# Patient Record
Sex: Male | Born: 1995 | Race: Black or African American | Hispanic: No | Marital: Single | Smoking: Current every day smoker
Health system: Southern US, Community
[De-identification: ages and names within clinical notes are randomized; demographics above are authoritative.]

## PROBLEM LIST (undated history)

## (undated) DIAGNOSIS — K219 Gastro-esophageal reflux disease without esophagitis: Secondary | ICD-10-CM

## (undated) DIAGNOSIS — R51 Headache: Secondary | ICD-10-CM

## (undated) DIAGNOSIS — T7840XA Allergy, unspecified, initial encounter: Secondary | ICD-10-CM

## (undated) DIAGNOSIS — Z87442 Personal history of urinary calculi: Secondary | ICD-10-CM

## (undated) DIAGNOSIS — F4310 Post-traumatic stress disorder, unspecified: Secondary | ICD-10-CM

## (undated) DIAGNOSIS — F909 Attention-deficit hyperactivity disorder, unspecified type: Secondary | ICD-10-CM

## (undated) DIAGNOSIS — R519 Headache, unspecified: Secondary | ICD-10-CM

## (undated) DIAGNOSIS — G8929 Other chronic pain: Secondary | ICD-10-CM

## (undated) HISTORY — PX: TONSILLECTOMY AND ADENOIDECTOMY: SUR1326

## (undated) HISTORY — DX: Gastro-esophageal reflux disease without esophagitis: K21.9

## (undated) HISTORY — DX: Allergy, unspecified, initial encounter: T78.40XA

## (undated) HISTORY — DX: Headache: R51

## (undated) HISTORY — DX: Headache, unspecified: R51.9

## (undated) HISTORY — DX: Attention-deficit hyperactivity disorder, unspecified type: F90.9

## (undated) HISTORY — DX: Post-traumatic stress disorder, unspecified: F43.10

## (undated) HISTORY — DX: Other chronic pain: G89.29

---
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
## 2016-03-12 ENCOUNTER — Encounter: Payer: Self-pay | Admitting: *Deleted

## 2016-03-12 ENCOUNTER — Emergency Department
Admission: EM | Admit: 2016-03-12 | Discharge: 2016-03-12 | Disposition: A | Discharge status: Home / Self Care | Payer: Medicaid Other | Attending: Emergency Medicine | Admitting: Emergency Medicine

## 2016-03-12 DIAGNOSIS — Z79899 Other long term (current) drug therapy: Secondary | ICD-10-CM | POA: Insufficient documentation

## 2016-03-12 DIAGNOSIS — S70362A Insect bite (nonvenomous), left thigh, initial encounter: Secondary | ICD-10-CM | POA: Diagnosis present

## 2016-03-12 DIAGNOSIS — Y939 Activity, unspecified: Secondary | ICD-10-CM | POA: Insufficient documentation

## 2016-03-12 DIAGNOSIS — W57XXXA Bitten or stung by nonvenomous insect and other nonvenomous arthropods, initial encounter: Secondary | ICD-10-CM | POA: Diagnosis not present

## 2016-03-12 DIAGNOSIS — Z792 Long term (current) use of antibiotics: Secondary | ICD-10-CM | POA: Diagnosis not present

## 2016-03-12 DIAGNOSIS — F17210 Nicotine dependence, cigarettes, uncomplicated: Secondary | ICD-10-CM | POA: Diagnosis not present

## 2016-03-12 DIAGNOSIS — F909 Attention-deficit hyperactivity disorder, unspecified type: Secondary | ICD-10-CM | POA: Insufficient documentation

## 2016-03-12 DIAGNOSIS — F1290 Cannabis use, unspecified, uncomplicated: Secondary | ICD-10-CM | POA: Diagnosis not present

## 2016-03-12 DIAGNOSIS — Y929 Unspecified place or not applicable: Secondary | ICD-10-CM | POA: Diagnosis not present

## 2016-03-12 DIAGNOSIS — Y999 Unspecified external cause status: Secondary | ICD-10-CM | POA: Insufficient documentation

## 2016-03-12 NOTE — ED Provider Notes (Signed)
[facility] Emergency Department Provider Note ____________________________________________  Time seen: Approximately 5:15 PM  I have reviewed the triage vital signs and the nursing notes.   HISTORY  Chief Complaint Tick Removal   HPI Michael [name] is a 20 y.o. male is here with complaint of tick bite to his left upper leg. Patient states it is been there for one day. Prior to his arrival in the emergency room the mother removed the body of the tick. The head is embedded in the skin.Patient rates his pain as 7 out of 10 but is not taking any over-the-counter medication.   Past Medical History  Diagnosis Date  . PTSD (post-traumatic stress disorder)   . GERD (gastroesophageal reflux disease)   . ADHD (attention deficit hyperactivity disorder)   . Allergy   . Chronic headache     Patient Active Problem List   Diagnosis Date Noted  . Hx of tympanostomy tubes 09/17/2015  . Attention deficit disorder with hyperactivity 05/09/2015  . Acne 05/09/2015  . Headache 05/09/2015  . Esophageal reflux 05/09/2015  . Personal history of traumatic brain injury 05/09/2015  . 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 Posttraumatic stress disorder 05/09/2015  . Chromophytosis 05/09/2015  . Decreased body weight 05/09/2015    Past Surgical History  Procedure Laterality Date  . Tonsillectomy and adenoidectomy      Current Outpatient Rx  Name  Route  Sig  Dispense  Refill  . azithromycin (ZITHROMAX) 500 MG tablet      Two tablets once   2 tablet   0   . carbamazepine (TEGRETOL) 200 MG tablet   Oral   Take 200 mg by mouth 3 (three) times daily.      2   . ciclopirox (LOPROX) 0.77 % cream   Topical   Apply topically 2 (two) times daily. Up to 4 weeks Patient not taking: Reported on 09/22/2015   90 g   1   . cloNIDine (CATAPRES) 0.1 MG tablet   Oral   Take 0.2 mg by mouth at bedtime.      2   . minocycline (MINOCIN) 100 MG capsule  Oral   Take 1 capsule (100 mg total) by mouth 2 (two) times daily. Patient not taking: Reported on 09/22/2015   60 capsule   0   . OLANZapine zydis (ZYPREXA) 5 MG disintegrating tablet      DISSOLVE 1 TABLET ON TONGUE AT BEDTIME      2   . VYVANSE 30 MG capsule   Oral   Take 30 mg by mouth every morning.      0     Dispense as written.     Allergies Penicillins and Tramadol hcl  Family History  Problem Relation Age of Onset  . Depression Mother   . Hyperlipidemia Mother   . Arthritis Mother   . Mitral valve prolapse Mother   . Alcohol abuse Father   . Epilepsy Brother   . AAA (abdominal aortic aneurysm) Brother     Social History Social History  Substance Use Topics  . Smoking status: Current Some Day Smoker    Types: Cigars  . Smokeless tobacco: None  . Alcohol Use: No    Review of Systems Constitutional: No fever/chills Cardiovascular: Denies chest pain. Respiratory: Denies shortness of breath. Gastrointestinal:  No nausea, no vomiting. Skin:Positive tick bite. Neurological: Negative for headaches, focal weakness or numbness.  10-point ROS otherwise negative.  ____________________________________________   PHYSICAL EXAM:  VITAL SIGNS:  ED Triage Vitals  Enc Vitals Group     BP 03/12/16 1654 131/61 mmHg     Pulse Rate 03/12/16 1654 75     Resp 03/12/16 1654 18     Temp 03/12/16 1654 98.4 F (36.9 C)     Temp Source 03/12/16 1654 Oral     SpO2 03/12/16 1654 97 %     Weight 03/12/16 1654 167 lb (75.751 kg)     Height 03/12/16 1654 5\' 11"  (1.803 m)     Head Cir --      Peak Flow --      Pain Score 03/12/16 1655 7     Pain Loc --      Pain Edu? --      Excl. in GC? --     Constitutional: Alert and oriented. Well appearing and in no acute distress. Eyes: Conjunctivae are normal. PERRL. EOMI. Head: Atraumatic. Nose: No congestion/rhinnorhea. Neck: No stridor.   Respiratory: Normal respiratory effort.   Musculoskeletal:Moves upper and  lower extremities without any difficulty. Normal gait was noted. Neurologic:  Normal speech and language. No gross focal neurologic deficits are appreciated. No gait instability. Skin:  Skin is warm, dry. Left inner upper thigh there is a small dot that is the head of the tick and embedded in the skin. No erythema surrounding skin. Psychiatric: Mood and affect are normal. Speech and behavior are normal.  ____________________________________________   LABS (all labs ordered are listed, but only abnormal results are displayed)  Labs Reviewed - No data to display  PROCEDURES  Procedure(s) performed: Area was prepped with an alcohol swab. Tick head was removed with the 18-gauge needle without any difficulty.   patient tolerated procedure well.  Critical Care performed: No  ____________________________________________   INITIAL IMPRESSION / ASSESSMENT AND PLAN / ED COURSE  Pertinent labs & imaging results that were available during my care of the patient were reviewed by me and considered in my medical decision making (see chart for details).  Patient will follow up with his primary care doctor if any continued problems or signs of infection. He is also told to Circle 4/20 on his calendar as the days that he had a tick bite in case there is problems in the future.  ____________________________________________   FINAL CLINICAL IMPRESSION(S) / ED DIAGNOSES  Final diagnoses:  Tick bite with subsequent removal of tick      Tommi RumpsRhonda L Summers, PA-C 03/12/16 1736  Sharyn CreamerMark Quale, MD 03/12/16 [phone]

## 2016-03-12 NOTE — Discharge Instructions (Signed)
Tick Bite Information Ticks are insects that attach themselves to the skin. There are many types of ticks. Common types include wood ticks and deer ticks. Sometimes, ticks carry diseases that can make a person very ill. The most common places for ticks to attach themselves are the scalp, neck, armpits, waist, and groin.  HOW CAN YOU PREVENT TICK BITES? Take these steps to help prevent tick bites when you are outdoors:  Wear long sleeves and long pants.  Wear white clothes so you can see ticks more easily.  Tuck your pant legs into your socks.  If walking on a trail, stay in the middle of the trail to avoid brushing against bushes.  Avoid walking through areas with long grass.  Put bug spray on all skin that is showing and along boot tops, pant legs, and sleeve cuffs.  Check clothes, hair, and skin often and before going inside.  Brush off any ticks that are not attached.  Take a shower or bath as soon as possible after being outdoors. HOW SHOULD YOU REMOVE A TICK? Ticks should be removed as soon as possible to help prevent diseases. 1. If latex gloves are available, put them on before trying to remove a tick. 2. Use tweezers to grasp the tick as close to the skin as possible. You may also use curved forceps or a tick removal tool. Grasp the tick as close to its head as possible. Avoid grasping the tick on its body. 3. Pull gently upward until the tick lets go. Do not twist the tick or jerk it suddenly. This may break off the tick's head or mouth parts. 4. Do not squeeze or crush the tick's body. This could force disease-carrying fluids from the tick into your body. 5. After the tick is removed, wash the bite area and your hands with soap and water or alcohol. 6. Apply a small amount of antiseptic cream or ointment to the bite site. 7. Wash any tools that were used. Do not try to remove a tick by applying a hot match, petroleum jelly, or fingernail polish to the tick. These methods do  not work. They may also increase the chances of disease being spread from the tick bite. WHEN SHOULD YOU SEEK HELP? Contact your health care provider if you are unable to remove a tick or if a part of the tick breaks off in the skin. After a tick bite, you need to watch for signs and symptoms of diseases that can be spread by ticks. Contact your health care provider if you develop any of the following:  Fever.  Rash.  Redness and puffiness (swelling) in the area of the tick bite.  Tender, puffy lymph glands.  Watery poop (diarrhea).  Weight loss.  Cough.  Feeling more tired than normal (fatigue).  Muscle, joint, or bone pain.  Belly (abdominal) pain.  Headache.  Change in your level of consciousness.  Trouble walking or moving your legs.  Loss of feeling (numbness) in the legs.  Loss of movement (paralysis).  Shortness of breath.  Confusion.  Throwing up (vomiting) many times.   This information is not intended to replace advice given to you by your health care provider. Make sure you discuss any questions you have with your health care provider.   Document Released: 02/02/2010 Document Revised: 07/11/2013 Document Reviewed: 04/18/2013 Elsevier Interactive Patient Education [employer]   Follow-up with your doctor if any continued problems. Watch for signs of infection and clean daily with mild soap  and water.

## 2016-03-12 NOTE — ED Notes (Signed)
Pt reports he pulled a tick off his left upper leg today and thinks he did not get it all.  States my leg hurts.

## 2016-03-30 ENCOUNTER — Encounter: Payer: Self-pay | Admitting: Family Medicine

## 2016-03-30 ENCOUNTER — Ambulatory Visit (INDEPENDENT_AMBULATORY_CARE_PROVIDER_SITE_OTHER): Payer: Medicaid Other | Admitting: Family Medicine

## 2016-03-30 ENCOUNTER — Other Ambulatory Visit: Payer: Self-pay | Admitting: Family Medicine

## 2016-03-30 VITALS — BP 106/52 | HR 60 | Temp 97.7°F | Resp 16 | Ht 69.5 in | Wt 170.2 lb

## 2016-03-30 DIAGNOSIS — Z113 Encounter for screening for infections with a predominantly sexual mode of transmission: Secondary | ICD-10-CM | POA: Diagnosis not present

## 2016-03-30 NOTE — Patient Instructions (Signed)
We will call you with the lab results. 

## 2016-03-30 NOTE — Progress Notes (Signed)
Subjective:     Patient ID: Romualdo Bolkahyme J Salvi, male   DOB: 07/14/1996, 20 y.o.   MRN: [id number]  HPI  Chief Complaint  Patient presents with  . Exposure to STD    Patient comes in office today for STD screen, he states that he is with a new sexual partner and would like to be screened.   States he has a daughter now, Layla. He has broken up with the baby's mother but shares joint custody. Had sexual intercourse with a new partner but the condom broke. Wishes screening for STD's. Previously treated for chlamydia in September of  2016. He states he has completed the [employer] program and is currently working at [employer].    Review of Systems     Objective:   Physical Exam  Constitutional: He appears well-developed and well-nourished. No distress.       Assessment:    1. Screening for STDs (sexually transmitted diseases) - GC/chlamydia probe amp, urine - HIV antibody (with reflex) - HSV(herpes simplex vrs) 1+2 ab-IgG - RPR    Plan:    Further f/u pending lab work.

## 2016-03-31 ENCOUNTER — Telehealth: Payer: Self-pay

## 2016-03-31 LAB — HIV ANTIBODY (ROUTINE TESTING W REFLEX): HIV SCREEN 4TH GENERATION: NONREACTIVE

## 2016-03-31 LAB — HSV(HERPES SIMPLEX VRS) I + II AB-IGG

## 2016-03-31 LAB — RPR: RPR Ser Ql: NONREACTIVE

## 2016-03-31 NOTE — Telephone Encounter (Signed)
-----   Message from Anola Gurneyobert Chauvin, [place] sent at 03/31/2016  7:52 AM EDT ----- Blood tests negative for STD; awaiting urine screen for GC/chlamydai

## 2016-03-31 NOTE — Telephone Encounter (Signed)
Patient has been advised. KW 

## 2016-04-03 LAB — SPECIMEN STATUS REPORT

## 2016-04-03 LAB — GC/CHLAMYDIA PROBE AMP
Chlamydia trachomatis, NAA: NEGATIVE
Neisseria gonorrhoeae by PCR: NEGATIVE

## 2016-04-05 ENCOUNTER — Telehealth: Payer: Self-pay

## 2016-04-05 NOTE — Telephone Encounter (Signed)
-----   Message from Anola Gurneyobert Chauvin, [place] sent at 04/05/2016  7:34 AM EDT ----- GC/chlamydia urine test negative

## 2016-04-05 NOTE — Telephone Encounter (Signed)
Patient has been advised. KW 

## 2016-05-17 ENCOUNTER — Emergency Department
Admission: EM | Admit: 2016-05-17 | Discharge: 2016-05-18 | Disposition: A | Discharge status: Home / Self Care | Payer: Medicaid Other | Attending: Emergency Medicine | Admitting: Emergency Medicine

## 2016-05-17 DIAGNOSIS — F329 Major depressive disorder, single episode, unspecified: Secondary | ICD-10-CM | POA: Insufficient documentation

## 2016-05-17 DIAGNOSIS — Z0289 Encounter for other administrative examinations: Secondary | ICD-10-CM | POA: Insufficient documentation

## 2016-05-17 DIAGNOSIS — F17210 Nicotine dependence, cigarettes, uncomplicated: Secondary | ICD-10-CM | POA: Insufficient documentation

## 2016-05-17 DIAGNOSIS — F1210 Cannabis abuse, uncomplicated: Secondary | ICD-10-CM | POA: Diagnosis not present

## 2016-05-17 DIAGNOSIS — F909 Attention-deficit hyperactivity disorder, unspecified type: Secondary | ICD-10-CM | POA: Insufficient documentation

## 2016-05-17 DIAGNOSIS — R45851 Suicidal ideations: Secondary | ICD-10-CM | POA: Diagnosis present

## 2016-05-17 DIAGNOSIS — F32A Depression, unspecified: Secondary | ICD-10-CM

## 2016-05-17 LAB — BASIC METABOLIC PANEL
Anion gap: 5 (ref 5–15)
BUN: 9 mg/dL (ref 6–20)
CHLORIDE: 109 mmol/L (ref 101–111)
CO2: 28 mmol/L (ref 22–32)
Calcium: 8.7 mg/dL — ABNORMAL LOW (ref 8.9–10.3)
Creatinine, Ser: 0.87 mg/dL (ref 0.61–1.24)
GFR calc non Af Amer: >60 mL/min (ref >60)
Glucose, Bld: 91 mg/dL (ref 65–99)
POTASSIUM: 3.8 mmol/L (ref 3.5–5.1)
SODIUM: 142 mmol/L (ref 135–145)

## 2016-05-17 LAB — CBC
HCT: 42.7 % (ref 40.0–52.0)
HEMOGLOBIN: 14.8 g/dL (ref 13.0–18.0)
MCH: 31.1 pg (ref 26.0–34.0)
MCHC: 34.7 g/dL (ref 32.0–36.0)
MCV: 89.5 fL (ref 80.0–100.0)
Platelets: 157 10*3/uL (ref 150–440)
RBC: 4.77 MIL/uL (ref 4.40–5.90)
RDW: 12.9 % (ref 11.5–14.5)
WBC: 6.8 10*3/uL (ref 3.8–10.6)

## 2016-05-17 LAB — ETHANOL

## 2016-05-17 NOTE — ED Notes (Signed)
Food tray given and drink

## 2016-05-17 NOTE — ED Notes (Signed)
Brought in by  PD pt denies any SI or HI at this time.

## 2016-05-18 ENCOUNTER — Ambulatory Visit: Payer: Medicaid Other | Admitting: Family Medicine

## 2016-05-18 ENCOUNTER — Encounter: Payer: Self-pay | Admitting: Occupational Medicine

## 2016-05-18 LAB — URINALYSIS COMPLETE WITH MICROSCOPIC (ARMC ONLY)
BACTERIA UA: NONE SEEN
Bilirubin Urine: NEGATIVE
Glucose, UA: NEGATIVE mg/dL
Hgb urine dipstick: NEGATIVE
Ketones, ur: NEGATIVE mg/dL
Leukocytes, UA: NEGATIVE
Nitrite: NEGATIVE
PH: 6 (ref 5.0–8.0)
PROTEIN: NEGATIVE mg/dL
Specific Gravity, Urine: 1.015 (ref 1.005–1.030)

## 2016-05-18 LAB — URINE DRUG SCREEN, QUALITATIVE (ARMC ONLY)
AMPHETAMINES, UR SCREEN: NOT DETECTED
BENZODIAZEPINE, UR SCRN: NOT DETECTED
Barbiturates, Ur Screen: NOT DETECTED
Cannabinoid 50 Ng, Ur ~~LOC~~: POSITIVE — AB
Cocaine Metabolite,Ur ~~LOC~~: NOT DETECTED
MDMA (Ecstasy)Ur Screen: NOT DETECTED
METHADONE SCREEN, URINE: NOT DETECTED
Opiate, Ur Screen: NOT DETECTED
PHENCYCLIDINE (PCP) UR S: NOT DETECTED
TRICYCLIC, UR SCREEN: NOT DETECTED

## 2016-05-18 LAB — ACETAMINOPHEN LEVEL

## 2016-05-18 LAB — SALICYLATE LEVEL
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
## 2016-05-18 NOTE — ED Notes (Signed)
Pt giving phone to call for a ride.

## 2016-05-18 NOTE — ED Notes (Addendum)
Pt awaiting on the [facility]

## 2016-05-18 NOTE — ED Provider Notes (Signed)
[facility] Emergency Department Provider Note   ____________________________________________  Time seen: Approximately 12:21 AM  I have reviewed the triage vital signs and the nursing notes.   HISTORY  Chief Complaint Mental Health Problem    HPI Michael [name] is a 20 y.o. male [place] [facility] [place] for depression with suicidal ideation. Patient with a history of ADHD, PTSD with prior suicide attempts. Patient states this is a misunderstanding and he told his brother "what happens to me in the streets is what happens". States that his brother misinterpreted that as suicidal ideation. Patient denies SI/HI/AH/VH. Voices no medical complaints.   Past Medical History  Diagnosis Date  . PTSD (post-traumatic stress disorder)   . GERD (gastroesophageal reflux disease)   . ADHD (attention deficit hyperactivity disorder)   . Allergy   . Chronic headache     Patient Active Problem List   Diagnosis Date Noted  . Hx of tympanostomy tubes 09/17/2015  . History of ADHD 05/09/2015  . Acne 05/09/2015  . Esophageal reflux 05/09/2015  . Personal history of traumatic brain injury 05/09/2015  . 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 Posttraumatic stress disorder 05/09/2015  . Chromophytosis 05/09/2015    Past Surgical History  Procedure Laterality Date  . Tonsillectomy and adenoidectomy      No current outpatient prescriptions on file.  Allergies Penicillins and Tramadol hcl  Family History  Problem Relation Age of Onset  . Depression Mother   . Hyperlipidemia Mother   . Arthritis Mother   . Mitral valve prolapse Mother   . Alcohol abuse Father   . Epilepsy Brother   . AAA (abdominal aortic aneurysm) Brother     Social History Social History  Substance Use Topics  . Smoking status: Current Some Day Smoker    Types: Cigars  . Smokeless tobacco: None  . Alcohol Use: No    Review of  Systems  Constitutional: No fever/chills. Eyes: No visual changes. ENT: No sore throat. Cardiovascular: Denies chest pain. Respiratory: Denies shortness of breath. Gastrointestinal: No abdominal pain.  No nausea, no vomiting.  No diarrhea.  No constipation. Genitourinary: Negative for dysuria. Musculoskeletal: Negative for back pain. Skin: Negative for rash. Neurological: Negative for headaches, focal weakness or numbness. Psychiatric:Positive for depression.  10-point ROS otherwise negative.  ____________________________________________   PHYSICAL EXAM:  VITAL SIGNS: ED Triage Vitals  Enc Vitals Group     BP 05/17/16 2233 119/68 mmHg     Pulse Rate 05/17/16 2233 67     Resp 05/17/16 2233 18     Temp 05/17/16 2233 98.3 F (36.8 C)     Temp Source 05/17/16 2233 Oral     SpO2 05/17/16 2233 98 %     Weight 05/17/16 2233 157 lb (71.215 kg)     Height 05/17/16 2233 5\' 9"  (1.753 m)     Head Cir --      Peak Flow --      Pain Score --      Pain Loc --      Pain Edu? --      Excl. in GC? --     Constitutional: Alert and oriented. Well appearing and in no acute distress. Eyes: Conjunctivae are normal. PERRL. EOMI. Head: Atraumatic. Nose: No congestion/rhinnorhea. Mouth/Throat: Mucous membranes are moist.  Oropharynx non-erythematous. Neck: No stridor.   Cardiovascular: Normal rate, regular rhythm. Grossly normal heart sounds.  Good peripheral circulation. Respiratory: Normal respiratory effort.  No retractions. Lungs CTAB. Gastrointestinal: Soft and nontender. No distention. No abdominal bruits. No CVA tenderness. Musculoskeletal: No lower extremity tenderness nor edema.  No joint effusions. Neurologic:  Normal speech and language. No gross focal neurologic deficits are appreciated. No gait instability. Skin:  Skin is warm, dry and intact. No rash noted. Psychiatric: Mood and affect are normal. Speech and behavior are  normal.  ____________________________________________   LABS (all labs ordered are listed, but only abnormal results are displayed)  Labs Reviewed  BASIC METABOLIC PANEL - Abnormal; Notable for the following:    Calcium 8.7 (*)    All other components within normal limits  URINALYSIS COMPLETEWITH MICROSCOPIC ([facility] ONLY) - Abnormal; Notable for the following:    Color, Urine YELLOW (*)    APPearance CLEAR (*)    Squamous Epithelial / LPF 0-5 (*)    All other components within normal limits  URINE DRUG SCREEN, QUALITATIVE ([facility] ONLY) - Abnormal; Notable for the following:    Cannabinoid 50 Ng, Ur [place] POSITIVE (*)    All other components within normal limits  ACETAMINOPHEN LEVEL - Abnormal; Notable for the following:    Acetaminophen (Tylenol), Serum <10 (*)    All other components within normal limits  CBC  ETHANOL  SALICYLATE LEVEL   ____________________________________________  EKG  None ____________________________________________  RADIOLOGY  None ____________________________________________   PROCEDURES  Procedure(s) performed: None  Critical Care performed: No  ____________________________________________   INITIAL IMPRESSION / ASSESSMENT AND PLAN / ED COURSE  Pertinent labs & imaging results that were available during my care of the patient were reviewed by me and considered in my medical decision making (see chart for details).  20 year old male with a history of ADHD and PTSD who presents under IVC for depression with suicidal ideation. Will remain under IVC pending TTS and psychiatry evaluations.  ----------------------------------------- 3:07 AM on 05/18/2016 -----------------------------------------  [facility] feels there are no safety concerns and that the patient is psychiatrically stable for discharge home. He will rescind the IVC papers. Strict return precautions given. Patient verbalizes understanding and  agrees with plan of care.  ____________________________________________   FINAL CLINICAL IMPRESSION(S) / ED DIAGNOSES  Final diagnoses:  Depression  Marijuana abuse      NEW MEDICATIONS STARTED DURING THIS VISIT:  New Prescriptions   No medications on file     Note:  This document was prepared using Dragon voice recognition software and may include unintentional dictation errors.    Irean HongJade J Evlyn Amason, MD 05/18/16 [id number]

## 2016-05-18 NOTE — ED Notes (Signed)
Butch RN got dressed out and belongings in the [facility] locked room.

## 2016-05-18 NOTE — ED Notes (Signed)
Pt finished SOC awaiting Recommendations.

## 2016-05-18 NOTE — ED Notes (Addendum)
Pt doesnt understand why he is here. pt states police said he was a missing person then they take him IVC. His brother and father filed IVC said pt told brother he was going to take him self.  Pt states all I said was that in the streets be die all the time. I dont want to be walking in the streets. pt homeless at times right now staying with grandmother for the moment. she doesnt like [employer]. Pt noted to be restless says has a daugther to worry about and job in the morning. Requesting telepsych to get discharge.

## 2016-05-18 NOTE — ED Notes (Signed)
SOC in progress at this time.  

## 2016-05-18 NOTE — Discharge Instructions (Signed)
Return to the ER for recurrent or worsening symptoms, feelings of hurting yourself or others, or other concerns.  Major Depressive Disorder Major depressive disorder is a mental illness. It also may be called clinical depression or unipolar depression. Major depressive disorder usually causes feelings of sadness, hopelessness, or helplessness. Some people with this disorder do not feel particularly sad but lose interest in doing things they used to enjoy (anhedonia). Major depressive disorder also can cause physical symptoms. It can interfere with work, school, relationships, and other normal everyday activities. The disorder varies in severity but is longer lasting and more serious than the sadness we all feel from time to time in our lives. Major depressive disorder often is triggered by stressful life events or major life changes. Examples of these triggers include divorce, loss of your job or home, a move, and the death of a family member or close friend. Sometimes this disorder occurs for no obvious reason at all. People who have family members with major depressive disorder or bipolar disorder are at higher risk for developing this disorder, with or without life stressors. Major depressive disorder can occur at any age. It may occur just once in your life (single episode major depressive disorder). It may occur multiple times (recurrent major depressive disorder). SYMPTOMS People with major depressive disorder have either anhedonia or depressed mood on nearly a daily basis for at least 2 weeks or longer. Symptoms of depressed mood include:  Feelings of sadness (blue or down in the dumps) or emptiness.  Feelings of hopelessness or helplessness.  Tearfulness or episodes of crying (may be observed by others).  Irritability (children and adolescents). In addition to depressed mood or anhedonia or both, people with this disorder have at least four of the following symptoms:  Difficulty sleeping or  sleeping too much.   Significant change (increase or decrease) in appetite or weight.   Lack of energy or motivation.  Feelings of guilt and worthlessness.   Difficulty concentrating, remembering, or making decisions.  Unusually slow movement (psychomotor retardation) or restlessness (as observed by others).   Recurrent wishes for death, recurrent thoughts of self-harm (suicide), or a suicide attempt. People with major depressive disorder commonly have persistent negative thoughts about themselves, other people, and the world. People with severe major depressive disorder may experiencedistorted beliefs or perceptions about the world (psychotic delusions). They also may see or hear things that are not real (psychotic hallucinations). DIAGNOSIS Major depressive disorder is diagnosed through an assessment by your health care provider. Your health care provider will ask aboutaspects of your daily life, such as mood,sleep, and appetite, to see if you have the diagnostic symptoms of major depressive disorder. Your health care provider may ask about your medical history and use of alcohol or drugs, including prescription medicines. Your health care provider also may do a physical exam and blood work. This is because certain medical conditions and the use of certain substances can cause major depressive disorder-like symptoms (secondary depression). Your health care provider also may refer you to a mental health specialist for further evaluation and treatment. TREATMENT It is important to recognize the symptoms of major depressive disorder and seek treatment. The following treatments can be prescribed for this disorder:   Medicine. Antidepressant medicines usually are prescribed. Antidepressant medicines are thought to correct chemical imbalances in the brain that are commonly associated with major depressive disorder. Other types of medicine may be added if the symptoms do not respond to  antidepressant medicines alone or if psychotic  delusions or hallucinations occur.  Talk therapy. Talk therapy can be helpful in treating major depressive disorder by providing support, education, and guidance. Certain types of talk therapy also can help with negative thinking (cognitive behavioral therapy) and with relationship issues that trigger this disorder (interpersonal therapy). A mental health specialist can help determine which treatment is best for you. Most people with major depressive disorder do well with a combination of medicine and talk therapy. Treatments involving electrical stimulation of the brain can be used in situations with extremely severe symptoms or when medicine and talk therapy do not work over time. These treatments include electroconvulsive therapy, transcranial magnetic stimulation, and vagal nerve stimulation.   This information is not intended to replace advice given to you by your health care provider. Make sure you discuss any questions you have with your health care provider.   Document Released: 03/05/2013 Document Revised: 11/29/2014 Document Reviewed: 03/05/2013 Elsevier Interactive Patient Education 2016 [employer].  Cannabis Use Disorder Cannabis use disorder is a mental disorder. It is not one-time or occasional use of cannabis, more commonly known as marijuana. Cannabis use disorder is the continued, nonmedical use of cannabis that interferes with normal life activities or causes health problems. People with cannabis use disorder get a feeling of extreme pleasure and relaxation from cannabis use. This "high" is very rewarding and causes people to use over and over.  The mind-altering ingredient in cannabis is know as THC. THC can also interfere with motor coordination, memory, judgment, and accurate sense of space and time. These effects can last for a few days after using cannabis. Regular heavy cannabis use can cause long-lasting problems with thinking and  learning. In young people, these problems may be permanent. Cannabis sometimes causes severe anxiety, paranoia, or visual hallucinations. Man-made (synthetic) cannabis-like drugs, such as "spice" and "K2," cause the same effects as THC but are much stronger. Cannabis-like drugs can cause dangerously high blood pressure and heart rate.  Cannabis use disorder usually starts in the teenage years. It can trigger the development of schizophrenia. It is somewhat more common in men than women. People who have family members with the disorder or existing mental health issues such as depression and posttraumatic stress disorderare more likely to develop cannabis use disorder. People with cannabis use disorder are at higher risk for use of other drugs of abuse.  SIGNS AND SYMPTOMS Signs and symptoms of cannabis use disorder include:   Use of cannabis in larger amounts or over a longer period than intended.   Unsuccessful attempts to cut down or control cannabis use.   A lot of time spent obtaining, using, or recovering from the effects of cannabis.   A strong desire or urge to use cannabis (cravings).   Continued use of cannabis in spite of problems at work, school, or home because of use.   Continued use of cannabis in spite of relationship problems because of use.  Giving up or cutting down on important life activities because of cannabis use.  Use of cannabis over and over even in situations when it is physically hazardous, such as when driving a car.   Continued use of cannabis in spite of a physical problem that is likely related to use. Physical problems can include:  Chronic cough.  Bronchitis.  Emphysema.  Throat and lung cancer.  Continued use of cannabis in spite of a mental problem that is likely related to use. Mental problems can include:  Psychosis.  Anxiety.  Difficulty sleeping.  Need  to use more and more cannabis to get the same effect, or lessened effect over  time with use of the same amount (tolerance).  Having withdrawal symptoms when cannabis use is stopped, or using cannabis to reduce or avoid withdrawal symptoms. Withdrawal symptoms include:  Irritability or anger.  Anxiety or restlessness.  Difficulty sleeping.  Loss of appetite or weight.  Aches and pains.  Shakiness.  Sweating.  Chills. DIAGNOSIS Cannabis use disorder is diagnosed by your health care provider. You may be asked questions about your cannabis use and how it affects your life. A physical exam may be done. A drug screen may be done. You may be referred to a mental health professional. The diagnosis of cannabis use disorder requires at least two symptoms within 12 months. The type of cannabis use disorder you have depends on the number of symptoms you have. The type may be:  Mild. Two or three signs and symptoms.   Moderate. Four or five signs and symptoms.   Severe. Six or more signs and symptoms.  TREATMENT Treatment is usually provided by mental health professionals with training in substance use disorders. The following options are available:  Counseling or talk therapy. Talk therapy addresses the reasons you use cannabis. It also addresses ways to keep you from using again. The goals of talk therapy include:  Identifying and avoiding triggers for use.  Learning how to handle cravings.  Replacing use with healthy activities.  Support groups. Support groups provide emotional support, advice, and guidance.  Medicine. Medicine is used to treat mental health issues that trigger cannabis use or that result from it. HOME CARE INSTRUCTIONS  Take medicines only as directed by your health care provider.  Check with your health care provider before starting any new medicines.  Keep all follow-up visits as directed by your health care provider. SEEK MEDICAL CARE IF:  You are not able to take your medicines as directed.  Your symptoms get worse. SEEK  IMMEDIATE MEDICAL CARE IF: You have serious thoughts about hurting yourself or others. FOR MORE INFORMATION  National Institute on Drug Abuse: http://www.price-smith.com/www.drugabuse.gov  Substance Abuse and Mental Health Services Administration: Skate[employer].com.ptwww.samhsa.gov   This information is not intended to replace advice given to you by your health care provider. Make sure you discuss any questions you have with your health care provider.   Document Released: 11/05/2000 Document Revised: 11/29/2014 Document Reviewed: 11/21/2013 Elsevier Interactive Patient Education [employer]2016 Elsevier Inc.

## 2016-06-16 ENCOUNTER — Encounter: Payer: Self-pay | Admitting: Emergency Medicine

## 2016-06-16 ENCOUNTER — Emergency Department
Admission: EM | Admit: 2016-06-16 | Discharge: 2016-06-16 | Disposition: A | Discharge status: Home / Self Care | Payer: Medicaid Other | Attending: Emergency Medicine | Admitting: Emergency Medicine

## 2016-06-16 DIAGNOSIS — R5383 Other fatigue: Secondary | ICD-10-CM | POA: Diagnosis not present

## 2016-06-16 DIAGNOSIS — Z8719 Personal history of other diseases of the digestive system: Secondary | ICD-10-CM | POA: Diagnosis not present

## 2016-06-16 DIAGNOSIS — F17210 Nicotine dependence, cigarettes, uncomplicated: Secondary | ICD-10-CM | POA: Insufficient documentation

## 2016-06-16 DIAGNOSIS — R42 Dizziness and giddiness: Secondary | ICD-10-CM

## 2016-06-16 DIAGNOSIS — F909 Attention-deficit hyperactivity disorder, unspecified type: Secondary | ICD-10-CM | POA: Diagnosis not present

## 2016-06-16 DIAGNOSIS — F1290 Cannabis use, unspecified, uncomplicated: Secondary | ICD-10-CM | POA: Diagnosis not present

## 2016-06-16 LAB — URINALYSIS COMPLETE WITH MICROSCOPIC (ARMC ONLY)
BILIRUBIN URINE: NEGATIVE
Bacteria, UA: NONE SEEN
GLUCOSE, UA: NEGATIVE mg/dL
Hgb urine dipstick: NEGATIVE
KETONES UR: NEGATIVE mg/dL
LEUKOCYTES UA: NEGATIVE
Nitrite: NEGATIVE
Protein, ur: NEGATIVE mg/dL
RBC / HPF: NONE SEEN RBC/hpf (ref 0–5)
SPECIFIC GRAVITY, URINE: 1.011 (ref 1.005–1.030)
Squamous Epithelial / LPF: NONE SEEN
pH: 7 (ref 5.0–8.0)

## 2016-06-16 LAB — BASIC METABOLIC PANEL
ANION GAP: 5 (ref 5–15)
BUN: 6 mg/dL (ref 6–20)
CHLORIDE: 110 mmol/L (ref 101–111)
CO2: 26 mmol/L (ref 22–32)
CREATININE: 0.75 mg/dL (ref 0.61–1.24)
Calcium: 8.8 mg/dL — ABNORMAL LOW (ref 8.9–10.3)
GFR calc non Af Amer: >60 mL/min (ref >60)
Glucose, Bld: 106 mg/dL — ABNORMAL HIGH (ref 65–99)
POTASSIUM: 3.7 mmol/L (ref 3.5–5.1)
Sodium: 141 mmol/L (ref 135–145)

## 2016-06-16 LAB — CBC
HEMATOCRIT: 45.6 % (ref 40.0–52.0)
HEMOGLOBIN: 15.6 g/dL (ref 13.0–18.0)
MCH: 31.3 pg (ref 26.0–34.0)
MCHC: 34.2 g/dL (ref 32.0–36.0)
MCV: 91.4 fL (ref 80.0–100.0)
Platelets: 116 10*3/uL — ABNORMAL LOW (ref 150–440)
RBC: 4.99 MIL/uL (ref 4.40–5.90)
RDW: 13.2 % (ref 11.5–14.5)
WBC: 5.1 10*3/uL (ref 3.8–10.6)

## 2016-06-16 MED ORDER — BUTALBITAL-APAP-CAFFEINE 50-325-40 MG PO TABS: 1.0000 | ORAL_TABLET | Freq: Four times a day (QID) | ORAL | 0 refills | Status: DC | PRN

## 2016-06-16 NOTE — ED Provider Notes (Signed)
[facility] Emergency Department Provider Note  ____________________________________________  Time seen: Approximately 1:33 PM  I have reviewed the triage vital signs and the nursing notes.   HISTORY  Chief Complaint Dizziness and Fatigue    HPI Michael [name] is a 20 y.o. male who presents for evaluation of having some lightheadedness for the past 3 days. Patient states is intermittent and not doing anything. Feels like at times the room is spinning. States that he feels tired all the time. In addition patient reports that he's missed the last 3 days of work and needs a note to call back. Denies any chest pains. Denies any shortness of breath.   Past Medical History:  Diagnosis Date  . ADHD (attention deficit hyperactivity disorder)   . Allergy   . Chronic headache   . GERD (gastroesophageal reflux disease)   . PTSD (post-traumatic stress disorder)     Patient Active Problem List   Diagnosis Date Noted  . Hx of tympanostomy tubes 09/17/2015  . History of ADHD 05/09/2015  . Acne 05/09/2015  . Esophageal reflux 05/09/2015  . Personal history of traumatic brain injury 05/09/2015  . 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 Posttraumatic stress disorder 05/09/2015  . Chromophytosis 05/09/2015    Past Surgical History:  Procedure Laterality Date  . TONSILLECTOMY AND ADENOIDECTOMY      Prior to Admission medications   Medication Sig Start Date End Date Taking? Authorizing Provider  butalbital-acetaminophen-caffeine (FIORICET) 50-325-40 MG tablet Take 1-2 tablets by mouth every 6 (six) hours as needed for headache. 06/16/16   Evangeline Dakin, PA-C    Allergies Penicillins and Tramadol hcl  Family History  Problem Relation Age of Onset  . Depression Mother   . Hyperlipidemia Mother   . Arthritis Mother   . Mitral valve prolapse Mother   . Alcohol abuse Father   . Epilepsy Brother   . AAA (abdominal aortic aneurysm) Brother      Social History Social History  Substance Use Topics  . Smoking status: Current Some Day Smoker    Types: Cigars  . Smokeless tobacco: Not on file  . Alcohol use No    Review of Systems Constitutional: No fever/chills Eyes: No visual changes. ENT: No sore throat. Cardiovascular: Denies chest pain. Respiratory: Denies shortness of breath. Gastrointestinal: No abdominal pain.  No nausea, no vomiting.  No diarrhea.  No constipation. Genitourinary: Negative for dysuria. Musculoskeletal: Negative for back pain. Skin: Negative for rash. Neurological: Negative for headaches, focal weakness or numbness.Positive for dizziness and generalized fatigue.  10-point ROS otherwise negative.  ____________________________________________   PHYSICAL EXAM:  VITAL SIGNS: ED Triage Vitals  Enc Vitals Group     BP 06/16/16 1137 114/64     Pulse Rate 06/16/16 1137 63     Resp 06/16/16 1137 16     Temp 06/16/16 1137 98.1 F (36.7 C)     Temp Source 06/16/16 1137 Oral     SpO2 06/16/16 1137 98 %     Weight 06/16/16 1136 175 lb (79.4 kg)     Height 06/16/16 1136  (1.778 m)     Head Circumference --      Peak Flow --      Pain Score --      Pain Loc --      Pain Edu? --      Excl. in GC? --     Constitutional: Alert and oriented. Well appearing and in no acute distress. Eyes: Conjunctivae are  normal. PERRL. EOMI. Head: Atraumatic. Nose: No congestion/rhinnorhea. Mouth/Throat: Mucous membranes are moist.  Oropharynx non-erythematous. Neck: No stridor.   Cardiovascular: Normal rate, regular rhythm. Grossly normal heart sounds.  Good peripheral circulation. Respiratory: Normal respiratory effort.  No retractions. Lungs CTAB. Musculoskeletal: No lower extremity tenderness nor edema.  No joint effusions. Neurologic:  Normal speech and language. No gross focal neurologic deficits are appreciated. No gait instability. Negative Romberg Skin:  Skin is warm, dry and intact. No rash  noted. Psychiatric: Mood and affect are normal. Speech and behavior are normal.  ____________________________________________   LABS (all labs ordered are listed, but only abnormal results are displayed)  Labs Reviewed  BASIC METABOLIC PANEL - Abnormal; Notable for the following:       Result Value   Glucose, Bld 106 (*)    Calcium 8.8 (*)    All other components within normal limits  CBC - Abnormal; Notable for the following:    Platelets 116 (*)    All other components within normal limits  URINALYSIS COMPLETEWITH MICROSCOPIC ([facility] ONLY) - Abnormal; Notable for the following:    Color, Urine YELLOW (*)    APPearance CLEAR (*)    All other components within normal limits  CBG MONITORING, ED   ____________________________________________  EKG   ____________________________________________  RADIOLOGY   ____________________________________________   PROCEDURES  Procedure(s) performed: None  Critical Care performed: No  ____________________________________________   INITIAL IMPRESSION / ASSESSMENT AND PLAN / ED COURSE  Pertinent labs & imaging results that were available during my care of the patient were reviewed by me and considered in my medical decision making (see chart for details).  Generalized fatigue with occasional episodes of vertigo. Rx given for Fioricet as needed for the headaches which come and go. Do not feel that meclizine be appropriate at this time since he is no longer feeling dizzy. Excuse given to return to work tomorrow.  Clinical Course    ____________________________________________   FINAL CLINICAL IMPRESSION(S) / ED DIAGNOSES  Final diagnoses:  Lightheadedness     This chart was dictated using voice recognition software/Dragon. Despite best efforts to proofread, errors can occur which can change the meaning. Any change was purely unintentional.    Evangeline Dakin, PA-C 06/16/16 1454    Jene Every, MD 06/16/16  [phone]

## 2016-06-16 NOTE — ED Triage Notes (Addendum)
Pt c/o lightheadedness for past 3 days. Reports this is intermittent and not linked to anything that he can tell.  When closes eyes feels like things spinning.  Reports extreme fatigue but unable to fall asleep till 6 am. Missed 3 days of work and reports needs a note. Has felt generally weak.

## 2016-09-08 ENCOUNTER — Encounter: Payer: Self-pay | Admitting: Family Medicine

## 2016-09-08 ENCOUNTER — Ambulatory Visit (INDEPENDENT_AMBULATORY_CARE_PROVIDER_SITE_OTHER): Payer: Medicaid Other | Admitting: Family Medicine

## 2016-09-08 VITALS — BP 102/60 | HR 95 | Temp 98.5°F | Resp 18 | Wt 181.4 lb

## 2016-09-08 DIAGNOSIS — B349 Viral infection, unspecified: Secondary | ICD-10-CM | POA: Diagnosis not present

## 2016-09-08 DIAGNOSIS — B360 Pityriasis versicolor: Secondary | ICD-10-CM | POA: Diagnosis not present

## 2016-09-08 MED ORDER — HYDROCODONE-HOMATROPINE 5-1.5 MG/5ML PO SYRP: ORAL_SOLUTION | ORAL | 0 refills | Status: DC

## 2016-09-08 NOTE — Progress Notes (Signed)
Subjective:     Patient ID: Michael [name], male   DOB: 03/29/1996, 20 y.o.   MRN: [id number]  HPI  Chief Complaint  Patient presents with  . Cough    Patient comes in office today with complaints of cough and congestion for the past 3 days. Patient states that his cough has been productive of yellow bloody sputum and shortness of breath and wheezing. Patient states that he has been taking otc Motrin.   Currently working two jobs and requires work excuses. States he has quit smoking. Reports chills but not fever or significant body aches. Wishes referral for dermatology for recurrent Tinea versicolor.   Review of Systems     Objective:   Physical Exam  Constitutional: He appears well-developed and well-nourished. No distress.  Ears: T.M's intact without inflammation Throat: mild tonsillar enlargement with mild erythema. Neck: Tender left anterior cervical node Lungs: clear     Assessment:    1. Acute viral syndrome - HYDROcodone-homatropine (HYCODAN) 5-1.5 MG/5ML syrup; 5 ml 4-6 hours as needed for cough  Dispense: 240 mL; Refill: 0  2. Tinea versicolor - Ambulatory referral to Dermatology    Plan:    Discussed use of Mucinex D and Delsym. Work excuses for 10/17-10/20.

## 2016-09-08 NOTE — Patient Instructions (Signed)
Discussed use of Mucinex D for congestion and Delsym for cough. 

## 2016-09-28 ENCOUNTER — Encounter: Payer: Medicaid Other | Admitting: Family Medicine

## 2016-10-17 ENCOUNTER — Emergency Department
Admission: EM | Admit: 2016-10-17 | Discharge: 2016-10-17 | Disposition: A | Discharge status: Home / Self Care | Payer: Medicaid Other | Attending: Emergency Medicine | Admitting: Emergency Medicine

## 2016-10-17 ENCOUNTER — Emergency Department: Payer: Medicaid Other

## 2016-10-17 DIAGNOSIS — Z87891 Personal history of nicotine dependence: Secondary | ICD-10-CM | POA: Insufficient documentation

## 2016-10-17 DIAGNOSIS — F909 Attention-deficit hyperactivity disorder, unspecified type: Secondary | ICD-10-CM | POA: Insufficient documentation

## 2016-10-17 DIAGNOSIS — Y9302 Activity, running: Secondary | ICD-10-CM | POA: Insufficient documentation

## 2016-10-17 DIAGNOSIS — Y929 Unspecified place or not applicable: Secondary | ICD-10-CM | POA: Insufficient documentation

## 2016-10-17 DIAGNOSIS — Y999 Unspecified external cause status: Secondary | ICD-10-CM | POA: Insufficient documentation

## 2016-10-17 DIAGNOSIS — S93402A Sprain of unspecified ligament of left ankle, initial encounter: Secondary | ICD-10-CM | POA: Diagnosis not present

## 2016-10-17 DIAGNOSIS — S93422A Sprain of deltoid ligament of left ankle, initial encounter: Secondary | ICD-10-CM

## 2016-10-17 DIAGNOSIS — W2209XA Striking against other stationary object, initial encounter: Secondary | ICD-10-CM | POA: Insufficient documentation

## 2016-10-17 DIAGNOSIS — F4310 Post-traumatic stress disorder, unspecified: Secondary | ICD-10-CM | POA: Diagnosis not present

## 2016-10-17 DIAGNOSIS — S99912A Unspecified injury of left ankle, initial encounter: Secondary | ICD-10-CM | POA: Diagnosis present

## 2016-10-17 MED ORDER — NAPROXEN 500 MG PO TABS: 500.0000 mg | ORAL_TABLET | Freq: Two times a day (BID) | ORAL | Status: DC

## 2016-10-17 MED ORDER — NAPROXEN 500 MG PO TABS
500.0000 mg | ORAL_TABLET | Freq: Once | ORAL | Status: AC
  Administered 2016-10-17: 500 mg via ORAL
  Filled 2016-10-17: qty 1
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
## 2016-12-13 ENCOUNTER — Emergency Department
Admission: EM | Admit: 2016-12-13 | Discharge: 2016-12-13 | Disposition: A | Discharge status: Home / Self Care | Payer: Medicaid Other | Attending: Emergency Medicine | Admitting: Emergency Medicine

## 2016-12-13 ENCOUNTER — Encounter: Payer: Self-pay | Admitting: Emergency Medicine

## 2016-12-13 DIAGNOSIS — F1210 Cannabis abuse, uncomplicated: Secondary | ICD-10-CM | POA: Diagnosis not present

## 2016-12-13 DIAGNOSIS — F1910 Other psychoactive substance abuse, uncomplicated: Secondary | ICD-10-CM | POA: Insufficient documentation

## 2016-12-13 DIAGNOSIS — Z791 Long term (current) use of non-steroidal anti-inflammatories (NSAID): Secondary | ICD-10-CM | POA: Diagnosis not present

## 2016-12-13 DIAGNOSIS — Z79899 Other long term (current) drug therapy: Secondary | ICD-10-CM | POA: Insufficient documentation

## 2016-12-13 DIAGNOSIS — F909 Attention-deficit hyperactivity disorder, unspecified type: Secondary | ICD-10-CM | POA: Diagnosis not present

## 2016-12-13 DIAGNOSIS — F1310 Sedative, hypnotic or anxiolytic abuse, uncomplicated: Secondary | ICD-10-CM

## 2016-12-13 DIAGNOSIS — F17290 Nicotine dependence, other tobacco product, uncomplicated: Secondary | ICD-10-CM | POA: Insufficient documentation

## 2016-12-13 LAB — COMPREHENSIVE METABOLIC PANEL
ALBUMIN: 4.2 g/dL (ref 3.5–5.0)
ALT: 17 U/L (ref 17–63)
AST: 21 U/L (ref 15–41)
Alkaline Phosphatase: 76 U/L (ref 38–126)
Anion gap: 6 (ref 5–15)
BUN: 13 mg/dL (ref 6–20)
CHLORIDE: 105 mmol/L (ref 101–111)
CO2: 29 mmol/L (ref 22–32)
CREATININE: 0.84 mg/dL (ref 0.61–1.24)
Calcium: 9.4 mg/dL (ref 8.9–10.3)
GFR calc non Af Amer: >60 mL/min (ref >60)
GLUCOSE: 92 mg/dL (ref 65–99)
Potassium: 4.9 mmol/L (ref 3.5–5.1)
SODIUM: 140 mmol/L (ref 135–145)
Total Bilirubin: 0.9 mg/dL (ref 0.3–1.2)
Total Protein: 7.2 g/dL (ref 6.5–8.1)

## 2016-12-13 LAB — URINE DRUG SCREEN, QUALITATIVE (ARMC ONLY)
AMPHETAMINES, UR SCREEN: NOT DETECTED
Barbiturates, Ur Screen: NOT DETECTED
Benzodiazepine, Ur Scrn: NOT DETECTED
Cannabinoid 50 Ng, Ur ~~LOC~~: POSITIVE — AB
Cocaine Metabolite,Ur ~~LOC~~: NOT DETECTED
MDMA (ECSTASY) UR SCREEN: NOT DETECTED
Methadone Scn, Ur: NOT DETECTED
OPIATE, UR SCREEN: NOT DETECTED
PHENCYCLIDINE (PCP) UR S: NOT DETECTED
Tricyclic, Ur Screen: NOT DETECTED

## 2016-12-13 LAB — CBC
HEMATOCRIT: 46.9 % (ref 40.0–52.0)
Hemoglobin: 16.2 g/dL (ref 13.0–18.0)
MCH: 31.3 pg (ref 26.0–34.0)
MCHC: 34.6 g/dL (ref 32.0–36.0)
MCV: 90.4 fL (ref 80.0–100.0)
Platelets: 211 10*3/uL (ref 150–440)
RBC: 5.19 MIL/uL (ref 4.40–5.90)
RDW: 12.6 % (ref 11.5–14.5)
WBC: 6.8 10*3/uL (ref 3.8–10.6)

## 2016-12-13 LAB — ETHANOL: Alcohol, Ethyl (B): <5 mg/dL (ref <5)

## 2016-12-13 MED ORDER — ACETAMINOPHEN 500 MG PO TABS
1000.0000 mg | ORAL_TABLET | Freq: Once | ORAL | Status: AC
  Administered 2016-12-13: 1000 mg via ORAL

## 2016-12-13 MED ORDER — ACETAMINOPHEN 500 MG PO TABS
ORAL_TABLET | ORAL | Status: AC
Start: 2016-12-13 — End: 2016-12-13
  Administered 2016-12-13: 1000 mg via ORAL
  Filled 2016-12-13: qty 2

## 2016-12-13 NOTE — ED Notes (Signed)
TTS in [place] contacted for patient to have benzo and marijuana detox. TTS to call back.

## 2016-12-13 NOTE — ED Provider Notes (Addendum)
[facility] Emergency Department Provider Note  ____________________________________________  Time seen: Approximately 1:43 PM  I have reviewed the triage vital signs and the nursing notes.   HISTORY  Chief Complaint Depression    HPI Michael [name] is a 21 y.o. male with a history of polysubstance abuse presenting for detox from Xanax and marijuana. The patient was recently released from jail and states he does not want abuse medications or marijuana anymore. He denies any alcohol use. He has no medical complaints at this time.   Past Medical History:  Diagnosis Date  . ADHD (attention deficit hyperactivity disorder)   . Allergy   . Chronic headache   . GERD (gastroesophageal reflux disease)   . PTSD (post-traumatic stress disorder)     Patient Active Problem List   Diagnosis Date Noted  . Hx of tympanostomy tubes 09/17/2015  . History of ADHD 05/09/2015  . Acne 05/09/2015  . Esophageal reflux 05/09/2015  . Personal history of traumatic brain injury 05/09/2015  . 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 Posttraumatic stress disorder 05/09/2015  . Chromophytosis 05/09/2015    Past Surgical History:  Procedure Laterality Date  . TONSILLECTOMY AND ADENOIDECTOMY      Current Outpatient Rx  . Order #: [id number] Class: Print  . Order #: [id number] Class: Print  . Order #: [id number] Class: Print    Allergies Penicillins and Tramadol hcl  Family History  Problem Relation Age of Onset  . Depression Mother   . Hyperlipidemia Mother   . Arthritis Mother   . Mitral valve prolapse Mother   . Alcohol abuse Father   . Epilepsy Brother   . AAA (abdominal aortic aneurysm) Brother     Social History Social History  Substance Use Topics  . Smoking status: Former Smoker    Types: Cigars  . Smokeless tobacco: Never Used  . Alcohol use No    Review of Systems Constitutional: No fever/chills. Eyes: No visual changes. ENT: No  sore throat. No congestion or rhinorrhea. Cardiovascular: Denies chest pain.  Respiratory: Denies shortness of breath.  No cough. Gastrointestinal: No abdominal pain.  No nausea, no vomiting.  No diarrhea.   Musculoskeletal: Negative formsucle pain. Skin: Negative for rash. Neurological: Negative for headaches. No focal numbness, tingling or weakness.  Psychiatric:Positive polysubstance abuse. No SI, HI or hallucinations.  10-point ROS otherwise negative.  ____________________________________________   PHYSICAL EXAM:  VITAL SIGNS: ED Triage Vitals [12/13/16 1210]  Enc Vitals Group     BP 115/65     Pulse Rate 72     Resp 18     Temp 98.9 F (37.2 C)     Temp Source Oral     SpO2 98 %     Weight 178 lb (80.7 kg)     Height      Head Circumference      Peak Flow      Pain Score      Pain Loc      Pain Edu?      Excl. in GC?     Constitutional: Alert and oriented. Well appearing and in no acute distress. Answers questions appropriately. Eyes: Conjunctivae are normal.  EOMI. No scleral icterus. Head: Atraumatic. Nose: No congestion/rhinnorhea. Mouth/Throat: Mucous membranes are moist.  Neck: No stridor.  Supple.  No JVD. No meningismus. Cardiovascular: Normal rate, regular rhythm. No murmurs, rubs or gallops.  Respiratory: Normal respiratory effort.  No accessory muscle use or retractions. Lungs CTAB.  No wheezes, rales or ronchi. Musculoskeletal: No  LE edema.  Neurologic:  A&Ox3.  Speech is clear.  Face and smile are symmetric.  EOMI.  Moves all extremities well. Skin:  Skin is warm, dry and intact. No rash noted. Psychiatric: Mood and affect are normal. Speech and behavior are normal.  Normal judgement. Denies SI, HI, or hallucinations on my examination.  ____________________________________________   LABS (all labs ordered are listed, but only abnormal results are displayed)  Labs Reviewed  URINE DRUG SCREEN, QUALITATIVE ([facility] ONLY) - Abnormal; Notable for the  following:       Result Value   Cannabinoid 50 Ng, Ur [place] POSITIVE (*)    All other components within normal limits  COMPREHENSIVE METABOLIC PANEL  ETHANOL  CBC   ____________________________________________  EKG  Not indicated ____________________________________________  RADIOLOGY  No results found.  ____________________________________________   PROCEDURES  Procedure(s) performed: None  Procedures  Critical Care performed: No ____________________________________________   INITIAL IMPRESSION / ASSESSMENT AND PLAN / ED COURSE  Pertinent labs & imaging results that were available during my care of the patient were reviewed by me and considered in my medical decision making (see chart for details).  21 y.o. male with a history of polysubstance abuse presenting for detox from Xanax and marijuana. His urine drug screen is positive for marijuana only. His vital signs are stable and he has no acute medical issues at this time. I have consulted TTS for further evaluation.  ----------------------------------------- 3:24 PM on 12/13/2016 -----------------------------------------  The patient has been evaluated by TTS, and he does not meet criteria for immediate detox placement. However, we will give him discharge information for [facility] for detox, as well as [facility] for his prescriptions.  ____________________________________________  FINAL CLINICAL IMPRESSION(S) / ED DIAGNOSES  Final diagnoses:  Xanax use disorder, mild, abuse  Marijuana abuse         NEW MEDICATIONS STARTED DURING THIS VISIT:  New Prescriptions   No medications on file      Rockne MenghiniAnne-Caroline Jayke Caul, MD 12/13/16 1355    Rockne MenghiniAnne-Caroline Tameah Mihalko, MD 12/13/16 1524

## 2016-12-13 NOTE — ED Triage Notes (Addendum)
Pt here voluntary with reports of wanting to detox from drugs and reports depression .  Denies any thoughts of SI or HI.  Pt just got out of jail today and brought himself here to get help. Pt states he has been off his psych meds for over a year.

## 2016-12-13 NOTE — BHH Counselor (Signed)
[facility] reviewed pt- he does not meet criteria for detox as he has no benzos in his system currently. He does meet criteria for inpatient substance abuse treatment however there is a waiting list. [facility] instructs pt to call once a week to remain on list. Intake to [facility] is [phone]. RN notified and will relay this info to [phone]. Pt has also been instructed to follow up with [facility] for immediate behavioral health services.   [address] [place], [address]

## 2016-12-13 NOTE — ED Notes (Signed)
Pt given lunch tray with juice 

## 2016-12-13 NOTE — BH Assessment (Signed)
Writer faxed referral to [facility]

## 2016-12-13 NOTE — Discharge Instructions (Signed)
Please do not use or abuse prescription or illicit drugs.  Return to the emergency department if you develop thoughts of hurting yourself or anyone else, hallucinations, or any other symptoms concerning to you.

## 2017-02-24 ENCOUNTER — Encounter (HOSPITAL_COMMUNITY): Payer: Self-pay | Admitting: Emergency Medicine

## 2017-02-24 DIAGNOSIS — Z5321 Procedure and treatment not carried out due to patient leaving prior to being seen by health care provider: Secondary | ICD-10-CM | POA: Diagnosis not present

## 2017-02-24 DIAGNOSIS — F329 Major depressive disorder, single episode, unspecified: Secondary | ICD-10-CM | POA: Diagnosis present

## 2017-02-24 LAB — CBC WITH DIFFERENTIAL/PLATELET
BASOS ABS: 0 10*3/uL (ref 0.0–0.1)
BASOS PCT: 0 %
Eosinophils Absolute: 0.1 10*3/uL (ref 0.0–0.7)
Eosinophils Relative: 2 %
HEMATOCRIT: 42.1 % (ref 39.0–52.0)
Hemoglobin: 14.5 g/dL (ref 13.0–17.0)
Lymphocytes Relative: 59 %
Lymphs Abs: 3.8 10*3/uL (ref 0.7–4.0)
MCH: 30.7 pg (ref 26.0–34.0)
MCHC: 34.4 g/dL (ref 30.0–36.0)
MCV: 89 fL (ref 78.0–100.0)
Monocytes Absolute: 0.4 10*3/uL (ref 0.1–1.0)
Monocytes Relative: 7 %
NEUTROS ABS: 2 10*3/uL (ref 1.7–7.7)
NEUTROS PCT: 32 %
Platelets: 247 10*3/uL (ref 150–400)
RBC: 4.73 MIL/uL (ref 4.22–5.81)
RDW: 12.4 % (ref 11.5–15.5)
WBC: 6.3 10*3/uL (ref 4.0–10.5)

## 2017-02-24 NOTE — ED Triage Notes (Signed)
Patient here for depression.  He states that he has been off his meds for about a year and a half.  He denies any SI or HI at this time.  He states that he has lower back pain and having some pain due to ingrown toe nail on the right foot, great toe.

## 2017-02-25 ENCOUNTER — Emergency Department (HOSPITAL_COMMUNITY)
Admission: EM | Admit: 2017-02-25 | Discharge: 2017-02-25 | Disposition: A | Discharge status: Home / Self Care | Payer: Medicaid Other | Attending: Emergency Medicine | Admitting: Emergency Medicine

## 2017-02-25 LAB — COMPREHENSIVE METABOLIC PANEL
ALBUMIN: 3.2 g/dL — AB (ref 3.5–5.0)
ALT: 12 U/L — ABNORMAL LOW (ref 17–63)
AST: 20 U/L (ref 15–41)
Alkaline Phosphatase: 57 U/L (ref 38–126)
Anion gap: 9 (ref 5–15)
BUN: 5 mg/dL — AB (ref 6–20)
CHLORIDE: 106 mmol/L (ref 101–111)
CO2: 25 mmol/L (ref 22–32)
Calcium: 8.5 mg/dL — ABNORMAL LOW (ref 8.9–10.3)
Creatinine, Ser: 0.82 mg/dL (ref 0.61–1.24)
GFR calc Af Amer: >60 mL/min (ref >60)
Glucose, Bld: 120 mg/dL — ABNORMAL HIGH (ref 65–99)
POTASSIUM: 3.4 mmol/L — AB (ref 3.5–5.1)
Sodium: 140 mmol/L (ref 135–145)
Total Bilirubin: 0.6 mg/dL (ref 0.3–1.2)
Total Protein: 5.2 g/dL — ABNORMAL LOW (ref 6.5–8.1)

## 2017-02-25 LAB — RAPID URINE DRUG SCREEN, HOSP PERFORMED
AMPHETAMINES: NOT DETECTED
Barbiturates: NOT DETECTED
Benzodiazepines: NOT DETECTED
Cocaine: NOT DETECTED
OPIATES: NOT DETECTED
Tetrahydrocannabinol: POSITIVE — AB

## 2017-02-25 LAB — ETHANOL

## 2017-02-25 NOTE — ED Notes (Signed)
Called pt name x3. No response. 

## 2017-05-17 ENCOUNTER — Encounter: Payer: Self-pay | Admitting: Emergency Medicine

## 2017-05-17 DIAGNOSIS — J029 Acute pharyngitis, unspecified: Secondary | ICD-10-CM | POA: Insufficient documentation

## 2017-05-17 DIAGNOSIS — Z5321 Procedure and treatment not carried out due to patient leaving prior to being seen by health care provider: Secondary | ICD-10-CM | POA: Diagnosis not present

## 2017-05-17 LAB — POCT RAPID STREP A: Streptococcus, Group A Screen (Direct): NEGATIVE

## 2017-05-17 NOTE — ED Triage Notes (Signed)
Patient ambulatory to triage with steady gait, without difficulty or distress noted; pt reports sore throat x wks with productive cough green sputum

## 2017-05-18 ENCOUNTER — Ambulatory Visit (INDEPENDENT_AMBULATORY_CARE_PROVIDER_SITE_OTHER): Payer: Medicaid Other | Admitting: Physician Assistant

## 2017-05-18 ENCOUNTER — Emergency Department
Admission: EM | Admit: 2017-05-18 | Discharge: 2017-05-18 | Discharge status: Against Medical Advice | Payer: Medicaid Other | Attending: Emergency Medicine | Admitting: Emergency Medicine

## 2017-05-18 ENCOUNTER — Encounter: Payer: Self-pay | Admitting: Physician Assistant

## 2017-05-18 VITALS — BP 108/70 | HR 84 | Temp 98.0°F | Resp 16 | Wt 181.0 lb

## 2017-05-18 DIAGNOSIS — R197 Diarrhea, unspecified: Secondary | ICD-10-CM | POA: Diagnosis not present

## 2017-05-18 DIAGNOSIS — J302 Other seasonal allergic rhinitis: Secondary | ICD-10-CM | POA: Diagnosis not present

## 2017-05-18 MED ORDER — LEVOCETIRIZINE DIHYDROCHLORIDE 5 MG PO TABS: 5.0000 mg | ORAL_TABLET | Freq: Every evening | ORAL | 0 refills | Status: DC

## 2017-05-18 NOTE — ED Notes (Signed)
Pt to stat desk c/o increased throat pain. Pt offered tylenol for his pain.

## 2017-05-18 NOTE — ED Notes (Signed)
Pt refused mediation and states he really needs a work note and he will just go to urgent care tomorrow and get one.

## 2017-05-18 NOTE — Progress Notes (Signed)
Patient: Michael [name] Male    DOB: 01/20/1996   21 y.o.   MRN: [id number] Visit Date: 05/18/2017  Today's Provider: Trey SailorsAdriana M Deliliah Spranger, PA-C   Chief Complaint  Patient presents with  . Sore Throat    For two weeks.    Subjective:    Sore Throat   This is a new problem. The current episode started 1 to 4 weeks ago. The problem has been gradually worsening. The pain is worse on the left side. The pain is at a severity of 10/10. Associated symptoms include abdominal pain, congestion, coughing, diarrhea, ear discharge, ear pain (Left ear pain), headaches, shortness of breath and trouble swallowing. Pertinent negatives include no stridor or vomiting. He has tried NSAIDs for the symptoms. The treatment provided no relief.   Patient was in the ER late last night and had rapid strep test which was negative, also was sent for culture. He reports ear pain, scratchy throat, runny nose. Smokes one pack per day. Needs work note to return to work.     Allergies  Allergen Reactions  . Penicillins Other (See Comments)    Reaction: unknown  . Tramadol Hcl      Current Outpatient Prescriptions:  .  butalbital-acetaminophen-caffeine (FIORICET) 50-325-40 MG tablet, Take 1-2 tablets by mouth every 6 (six) hours as needed for headache. (Patient not taking: Reported on 05/18/2017), Disp: 20 tablet, Rfl: 0 .  naproxen (NAPROSYN) 500 MG tablet, Take 1 tablet (500 mg total) by mouth 2 (two) times daily with a meal. (Patient not taking: Reported on 05/18/2017), Disp: 20 tablet, Rfl: 00  Review of Systems  Constitutional: Positive for diaphoresis and fatigue. Negative for activity change, appetite change, chills, fever and unexpected weight change.  HENT: Positive for congestion, ear discharge, ear pain (Left ear pain), postnasal drip, rhinorrhea, sinus pain, sinus pressure, sneezing, sore throat and trouble swallowing. Negative for mouth sores, nosebleeds and tinnitus.   Eyes: Negative.   Respiratory:  Positive for cough, chest tightness, shortness of breath and wheezing. Negative for apnea, choking and stridor.   Gastrointestinal: Positive for abdominal pain and diarrhea. Negative for abdominal distention, anal bleeding, blood in stool, constipation, nausea, rectal pain and vomiting.  Allergic/Immunologic: Positive for environmental allergies.  Neurological: Positive for headaches. Negative for dizziness and light-headedness.    Social History  Substance Use Topics  . Smoking status: Current Every Day Smoker    Types: Cigars  . Smokeless tobacco: Never Used  . Alcohol use No   Objective:   BP 108/70 (BP Location: Right Arm, Patient Position: Sitting, Cuff Size: Normal)   Pulse 84   Temp 98 F (36.7 C) (Oral)   Resp 16   Wt 181 lb (82.1 kg)   BMI 25.24 kg/m  Vitals:   05/18/17 1406  BP: 108/70  Pulse: 84  Resp: 16  Temp: 98 F (36.7 C)  TempSrc: Oral  Weight: 181 lb (82.1 kg)     Physical Exam  Constitutional: He is oriented to person, place, and time. He appears well-developed and well-nourished.  HENT:  Mouth/Throat: Posterior oropharyngeal erythema present. No oropharyngeal exudate.  Neck: Neck supple.  Cardiovascular: Normal rate and regular rhythm.   Pulmonary/Chest: Effort normal. He has wheezes.  Minimal wheezing in LUL  Abdominal: Soft. Bowel sounds are normal. He exhibits no distension. There is no tenderness. There is no rebound and no guarding.  Lymphadenopathy:    He has no cervical adenopathy.  Neurological: He is alert and oriented  to person, place, and time.  Skin: Skin is warm and dry.  Psychiatric: He has a normal mood and affect. His behavior is normal.        Assessment & Plan:     1. Seasonal allergic rhinitis, unspecified trigger  Will try to treat as below. Strep negative, culture pending. Could possibly be mono but patient has no lymphadenopathy or focal abdominal tenderness, no organomegaly. Treatment symptomatic. Does not play contact  sports or exercise.   - levocetirizine (XYZAL) 5 MG tablet; Take 1 tablet (5 mg total) by mouth every evening.  Dispense: 40 tablet; Refill: 0  2. Diarrhea, unspecified type  Nonbloody, no history of travel, no night time awakenings. Stay well hydrated.   Return if symptoms worsen or fail to improve.  The entirety of the information documented in the History of Present Illness, Review of Systems and Physical Exam were personally obtained by me. Portions of this information were initially documented by Kavin Leech, CMA and reviewed by me for thoroughness and accuracy.         Trey Sailors, PA-C  [facility]

## 2017-05-18 NOTE — Patient Instructions (Signed)
Allergic Rhinitis Allergic rhinitis is when the mucous membranes in the nose respond to allergens. Allergens are particles in the air that cause your body to have an allergic reaction. This causes you to release allergic antibodies. Through a chain of events, these eventually cause you to release histamine into the blood stream. Although meant to protect the body, it is this release of histamine that causes your discomfort, such as frequent sneezing, congestion, and an itchy, runny nose. What are the causes? Seasonal allergic rhinitis (hay fever) is caused by pollen allergens that may come from grasses, trees, and weeds. Year-round allergic rhinitis (perennial allergic rhinitis) is caused by allergens such as house dust mites, pet dander, and mold spores. What are the signs or symptoms?  Nasal stuffiness (congestion).  Itchy, runny nose with sneezing and tearing of the eyes. How is this diagnosed? Your health care provider can help you determine the allergen or allergens that trigger your symptoms. If you and your health care provider are unable to determine the allergen, skin or blood testing may be used. Your health care provider will diagnose your condition after taking your health history and performing a physical exam. Your health care provider may assess you for other related conditions, such as asthma, pink eye, or an ear infection. How is this treated? Allergic rhinitis does not have a cure, but it can be controlled by:  Medicines that block allergy symptoms. These may include allergy shots, nasal sprays, and oral antihistamines.  Avoiding the allergen. Hay fever may often be treated with antihistamines in pill or nasal spray forms. Antihistamines block the effects of histamine. There are over-the-counter medicines that may help with nasal congestion and swelling around the eyes. Check with your health care provider before taking or giving this medicine. If avoiding the allergen or the  medicine prescribed do not work, there are many new medicines your health care provider can prescribe. Stronger medicine may be used if initial measures are ineffective. Desensitizing injections can be used if medicine and avoidance does not work. Desensitization is when a patient is given ongoing shots until the body becomes less sensitive to the allergen. Make sure you follow up with your health care provider if problems continue. Follow these instructions at home: It is not possible to completely avoid allergens, but you can reduce your symptoms by taking steps to limit your exposure to them. It helps to know exactly what you are allergic to so that you can avoid your specific triggers. Contact a health care provider if:  You have a fever.  You develop a cough that does not stop easily (persistent).  You have shortness of breath.  You start wheezing.  Symptoms interfere with normal daily activities. This information is not intended to replace advice given to you by your health care provider. Make sure you discuss any questions you have with your health care provider. Document Released: 08/03/2001 Document Revised: 07/09/2016 Document Reviewed: 07/16/2013 Elsevier Interactive Patient Education  2017 [employer]vier Inc.  

## 2017-05-19 ENCOUNTER — Telehealth: Payer: Self-pay | Admitting: Physician Assistant

## 2017-05-19 ENCOUNTER — Telehealth: Payer: Self-pay | Admitting: Emergency Medicine

## 2017-05-19 DIAGNOSIS — J020 Streptococcal pharyngitis: Secondary | ICD-10-CM

## 2017-05-19 LAB — CULTURE, GROUP A STREP (THRC)

## 2017-05-19 MED ORDER — AZITHROMYCIN 250 MG PO TABS: ORAL_TABLET | ORAL | 0 refills | Status: DC

## 2017-05-19 NOTE — Telephone Encounter (Signed)
Got in contact with patient and patient mother. Sent in z-pack to [facility] on [address]. Contacted patient this is just FYI. Work note will be on left side waiting.

## 2017-05-19 NOTE — Telephone Encounter (Signed)
Noted-aa 

## 2017-05-19 NOTE — Telephone Encounter (Signed)
Called [facility] to inform provider [facility]  Patient had already been seen at office.  They will notify provider,.

## 2017-05-19 NOTE — Telephone Encounter (Signed)
Tried to contact patient about positive Strep culture, # is out of service.

## 2017-07-11 ENCOUNTER — Telehealth: Payer: Self-pay

## 2017-07-11 NOTE — Telephone Encounter (Signed)
Patient called saying over the past month he has felt like his heart is skipping a beat. He reports that he experiences his symptoms at least once daily, and each episode lasts for 2-4mins. He is concerned because he has a family history of heart disease. Patient wanted to be evaluated to make sure his "heart pains" are benign.   Patient denies any other symptoms. No chest pain, shortness of breath with exertion, or headache. Patient was advised if symptoms persist or worsen that he should go to the ER. Patient verbalized understanding.

## 2017-07-12 ENCOUNTER — Ambulatory Visit (INDEPENDENT_AMBULATORY_CARE_PROVIDER_SITE_OTHER): Payer: Medicaid Other | Admitting: Family Medicine

## 2017-07-12 ENCOUNTER — Encounter: Payer: Self-pay | Admitting: Family Medicine

## 2017-07-12 ENCOUNTER — Other Ambulatory Visit: Payer: Self-pay | Admitting: Family Medicine

## 2017-07-12 VITALS — BP 120/82 | HR 66 | Temp 98.5°F | Resp 16 | Wt 180.8 lb

## 2017-07-12 DIAGNOSIS — F439 Reaction to severe stress, unspecified: Secondary | ICD-10-CM | POA: Diagnosis not present

## 2017-07-12 DIAGNOSIS — R079 Chest pain, unspecified: Secondary | ICD-10-CM | POA: Diagnosis not present

## 2017-07-12 DIAGNOSIS — F321 Major depressive disorder, single episode, moderate: Secondary | ICD-10-CM | POA: Diagnosis not present

## 2017-07-12 NOTE — Progress Notes (Signed)
Subjective:     Patient ID: Michael [name], male   DOB: 08/04/1996, 21 y.o.   MRN: [id number]  HPI  Chief Complaint  Patient presents with  . Panic Attack    Patient comes in office today with conerns of heart palpitations intermittent for the past month. Patient states that he has been under tremendous stress at home. Patient reports having sharp shooting pain in the middle of his chest radiating to the left side of his chest, he exeperinces symptoms of fatigue and states he averages 5-6hrs of sleep at night waking up often. Patient states that he has had emotional outburts where he has cried and has been having migraine headaches.   Describes sharp chest pain without palpitations which lasts up to 5 minutes intermittently over the last month. "I have a lot of stress". He is not working and the mother of his baby is looking for child support. He is completing work with the court for a previous charge but is not on probation. Currently smoking a few cigarettes daily and occasional marijuana. No ETOH or other recreational drugs. Previously going to [facility] but discontinued when he went to [facility] and discontinued all medication. States he intends to go back to [facility]. Accompanied by his grandmother and sister, Michael [name] today.   Review of Systems     Objective:   Physical Exam  Constitutional: He appears well-developed and well-nourished. No distress.  Cardiovascular: Normal rate and regular rhythm.   Pulmonary/Chest: Breath sounds normal.  Musculoskeletal: He exhibits no edema (of lower extremities).       Assessment:    1. Chest pain, unspecified type - EKG 12-Lead - Renal function panel - Magnesium  2. Situational stress  3. Depression, major, single episode, moderate (HCC)    Plan:    Further f/u pending lab work. He is to re-establish with [facility] this week.

## 2017-07-12 NOTE — Patient Instructions (Signed)
Please return to [facility] this week to start treatment for depression and stress. We will call you with the lab results.

## 2017-07-13 LAB — RENAL FUNCTION PANEL
Albumin: 4.4 g/dL (ref 3.5–5.5)
BUN / CREAT RATIO: 11 (ref 9–20)
BUN: 12 mg/dL (ref 6–20)
CALCIUM: 9.8 mg/dL (ref 8.7–10.2)
CO2: 26 mmol/L (ref 20–29)
CREATININE: 1.13 mg/dL (ref 0.76–1.27)
Chloride: 102 mmol/L (ref 96–106)
GFR calc non Af Amer: 93 mL/min/{1.73_m2} (ref >59)
GFR, EST AFRICAN AMERICAN: 108 mL/min/{1.73_m2} (ref >59)
Glucose: 83 mg/dL (ref 65–99)
Phosphorus: 3.9 mg/dL (ref 2.5–4.5)
Potassium: 4.5 mmol/L (ref 3.5–5.2)
SODIUM: 143 mmol/L (ref 134–144)

## 2017-07-13 LAB — MAGNESIUM: Magnesium: 2.1 mg/dL (ref 1.6–2.3)

## 2017-07-21 ENCOUNTER — Other Ambulatory Visit: Payer: Self-pay | Admitting: Family Medicine

## 2017-07-21 ENCOUNTER — Telehealth: Payer: Self-pay | Admitting: Family Medicine

## 2017-07-21 DIAGNOSIS — F339 Major depressive disorder, recurrent, unspecified: Secondary | ICD-10-CM

## 2017-07-21 NOTE — Telephone Encounter (Signed)
Spoke with patient on the phone and advised him of his recent lab results patient states that he has not gone to [facility] yet or called office. Patient reports that he is still having episodes of diarrhea and fatigue, he states that his symptoms started again this morning and reports that he has been feeling weak. Patient denies symptoms of anxiety or stress associated. KW

## 2017-07-21 NOTE — Telephone Encounter (Signed)
Patient called office back stating that it has been 2 years [facility] and would need a referral. KW

## 2017-07-21 NOTE — Telephone Encounter (Signed)
Referral in progress. 

## 2017-07-21 NOTE — Telephone Encounter (Signed)
Patient was advised he states that he will contact [facility] today. KW

## 2017-07-21 NOTE — Telephone Encounter (Signed)
Pt calling back about lab results. Pt still feeling really bad.  Nausea , tired  Pt call back is [phone]  Thanks Barth Kirksteri

## 2017-07-21 NOTE — Telephone Encounter (Signed)
I think his physical symptoms are all coming from stress and depression. I strongly encourage him to start back with mental health.

## 2017-09-19 ENCOUNTER — Encounter: Payer: Self-pay | Admitting: Emergency Medicine

## 2017-09-19 ENCOUNTER — Emergency Department
Admission: EM | Admit: 2017-09-19 | Discharge: 2017-09-19 | Disposition: A | Discharge status: Home / Self Care | Payer: Medicaid Other | Attending: Emergency Medicine | Admitting: Emergency Medicine

## 2017-09-19 DIAGNOSIS — F17290 Nicotine dependence, other tobacco product, uncomplicated: Secondary | ICD-10-CM | POA: Diagnosis not present

## 2017-09-19 DIAGNOSIS — F4325 Adjustment disorder with mixed disturbance of emotions and conduct: Secondary | ICD-10-CM | POA: Diagnosis not present

## 2017-09-19 DIAGNOSIS — F909 Attention-deficit hyperactivity disorder, unspecified type: Secondary | ICD-10-CM | POA: Diagnosis not present

## 2017-09-19 DIAGNOSIS — F329 Major depressive disorder, single episode, unspecified: Secondary | ICD-10-CM | POA: Diagnosis present

## 2017-09-19 DIAGNOSIS — F4320 Adjustment disorder, unspecified: Secondary | ICD-10-CM | POA: Insufficient documentation

## 2017-09-19 DIAGNOSIS — F1210 Cannabis abuse, uncomplicated: Secondary | ICD-10-CM

## 2017-09-19 DIAGNOSIS — R45851 Suicidal ideations: Secondary | ICD-10-CM

## 2017-09-19 LAB — ACETAMINOPHEN LEVEL: Acetaminophen (Tylenol), Serum: <10 ug/mL — ABNORMAL LOW (ref 10–30)

## 2017-09-19 LAB — COMPREHENSIVE METABOLIC PANEL
ALT: 16 U/L — AB (ref 17–63)
AST: 23 U/L (ref 15–41)
Albumin: 4 g/dL (ref 3.5–5.0)
Alkaline Phosphatase: 72 U/L (ref 38–126)
Anion gap: 7 (ref 5–15)
BUN: 8 mg/dL (ref 6–20)
CO2: 27 mmol/L (ref 22–32)
CREATININE: 0.79 mg/dL (ref 0.61–1.24)
Calcium: 9.2 mg/dL (ref 8.9–10.3)
Chloride: 104 mmol/L (ref 101–111)
GFR calc Af Amer: >60 mL/min (ref >60)
GFR calc non Af Amer: >60 mL/min (ref >60)
Glucose, Bld: 102 mg/dL — ABNORMAL HIGH (ref 65–99)
Potassium: 4 mmol/L (ref 3.5–5.1)
Sodium: 138 mmol/L (ref 135–145)
Total Bilirubin: 1.2 mg/dL (ref 0.3–1.2)
Total Protein: 6.7 g/dL (ref 6.5–8.1)

## 2017-09-19 LAB — CBC
HCT: 48.3 % (ref 40.0–52.0)
HEMOGLOBIN: 16.2 g/dL (ref 13.0–18.0)
MCH: 30.9 pg (ref 26.0–34.0)
MCHC: 33.5 g/dL (ref 32.0–36.0)
MCV: 92.2 fL (ref 80.0–100.0)
Platelets: 148 10*3/uL — ABNORMAL LOW (ref 150–440)
RBC: 5.24 MIL/uL (ref 4.40–5.90)
RDW: 13.4 % (ref 11.5–14.5)
WBC: 4.8 10*3/uL (ref 3.8–10.6)

## 2017-09-19 LAB — URINE DRUG SCREEN, QUALITATIVE (ARMC ONLY)
AMPHETAMINES, UR SCREEN: NOT DETECTED
BENZODIAZEPINE, UR SCRN: NOT DETECTED
Barbiturates, Ur Screen: NOT DETECTED
CANNABINOID 50 NG, UR ~~LOC~~: POSITIVE — AB
Cocaine Metabolite,Ur ~~LOC~~: NOT DETECTED
MDMA (ECSTASY) UR SCREEN: NOT DETECTED
Methadone Scn, Ur: NOT DETECTED
Opiate, Ur Screen: NOT DETECTED
Phencyclidine (PCP) Ur S: NOT DETECTED
TRICYCLIC, UR SCREEN: NOT DETECTED

## 2017-09-19 LAB — SALICYLATE LEVEL: Salicylate Lvl: <7 mg/dL (ref 2.8–30.0)

## 2017-09-19 LAB — ETHANOL: Alcohol, Ethyl (B): <10 mg/dL (ref <10)
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
## 2017-09-19 NOTE — ED Notes (Signed)
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
## 2017-09-19 NOTE — ED Notes (Signed)
BEHAVIORAL HEALTH ROUNDING Patient sleeping: No. Patient alert and oriented: yes Behavior appropriate: Yes.  ; If no, describe:  Nutrition and fluids offered: yes Toileting and hygiene offered: Yes  Sitter present: q15 minute observations and security  monitoring Law enforcement present: Yes  ODS  

## 2017-09-19 NOTE — Discharge Instructions (Signed)
please return for any further problems follow-up with [facility].

## 2017-09-19 NOTE — Consult Note (Signed)
[place] [facility] Consult   Reason for Consult: Consult for 21 year old man brought to the hospital under IVC Referring Physician: Rip Harbour Patient Identification: Michael [name] MRN:  [id number] Principal Diagnosis: Adjustment disorder with mixed disturbance of emotions and conduct Diagnosis:   Patient Active Problem List   Diagnosis Date Noted  . Adjustment disorder with mixed disturbance of emotions and conduct [F43.25] 09/19/2017  . Suicidal ideation [R45.851] 09/19/2017  . Cannabis abuse [F12.10] 09/19/2017  . Hx of tympanostomy tubes [Z98.890] 09/17/2015  . History of ADHD [Z86.59] 05/09/2015  . Acne [L70.9] 05/09/2015  . Esophageal reflux [K21.9] 05/09/2015  . Personal history of traumatic brain injury [Z87.820] 05/09/2015  . Developmental expressive writing disorder [F81.81] 05/09/2015  . Posttraumatic stress disorder [F43.10] 05/09/2015  . Chromophytosis [B36.0] 05/09/2015    Total Time spent with patient: 1 hour  Subjective:   Michael [name] is a 21 y.o. male patient admitted with "I was just upset".  HPI: Patient interviewed chart reviewed.  Also received a phone call from a representative at our [facility] earlier today.  This 21 year old man came into the emergency room under IVC.  He had made comments to his mother about being suicidal.  He has consistently denied that he meant it.  He says that last night he spent the night outdoors sleeping in a [place] and freezing because he had no other place to stay.  His mother called him on his phone this morning and asked him to come to her house to talk with her.  He says he lost his temper and said that he might as well kill himself but that he absolutely did not mean it.  He denies that his mood has been down or depressed.  Denies suicidal thoughts.  Denies recent sleeping problems.  But he admits that he uses marijuana pretty regularly.  Denies other drug abuse.  Patient says his life is chronically stressful because he never  has a stable place to stay but that he feels like he is coping with it pretty well.  He had made arrangements to start going to [place].  What he does not tell me is that he is being referred through the [facility] program which suggest some involvement from the criminal Justice system.  Medical history: Has had minor medical issues in the  past but nothing active or ongoing  Social history: Patient says he has been "on my own" since he was 17 years old.  He lives outside or with friends.  Not working.  Sounds like his family really is a little more involved than he lets on.  Unclear what his criminal situation is he denies that he has any.  Substance abuse history: Admits to daily marijuana use denies any other drugs denies feeling that his drug use is a problem  Past Psychiatric History: Patient had a past psychiatric admission when he was a child.  No history of suicide attempts.  Had been treated for ADHD and mood symptoms in the past.  Not currently taking any medication.  Denies any history of suicide attempts  Risk to Self: Suicidal Ideation: No-Not Currently/Within Last 6 Months Suicidal Intent: No Is patient at risk for suicide?: No Suicidal Plan?: No Access to Means: No What has been your use of drugs/alcohol within the last 12 months?: Pt reports daily THC use. Intentional Self Injurious Behavior: None Risk to Others: Homicidal Ideation: No Thoughts of Harm to Others: No Current Homicidal Intent: No Current Homicidal Plan: No Access to Homicidal Means:  No History of harm to others?: No Does patient have access to weapons?: No Criminal Charges Pending?: Yes Describe Pending Criminal Charges: THC possession, Breaking & entering Does patient have a court date: Yes Court Date:  Scientist, research (physical sciences)) Prior Inpatient Therapy: Prior Inpatient Therapy: Yes Prior Therapy Dates: Multiple Prior Therapy Facilty/Provider(s): [facility] Reason for Treatment: Anger, depression Prior Outpatient Therapy: Prior  Outpatient Therapy: Yes Prior Therapy Dates: Not Reported Prior Therapy Facilty/Provider(s): Not Reported Reason for Treatment: Not Reported Does patient have an ACCT team?: No Does patient have Intensive In-House Services?  : No Does patient have [facility] services? : No Does patient have P4CC services?: No  Past Medical History:  Past Medical History:  Diagnosis Date  . ADHD (attention deficit hyperactivity disorder)   . Allergy   . Chronic headache   . GERD (gastroesophageal reflux disease)   . PTSD (post-traumatic stress disorder)     Past Surgical History:  Procedure Laterality Date  . TONSILLECTOMY AND ADENOIDECTOMY     Family History:  Family History  Problem Relation Age of Onset  . Depression Mother   . Hyperlipidemia Mother   . Arthritis Mother   . Mitral valve prolapse Mother   . Alcohol abuse Father   . Epilepsy Brother   . AAA (abdominal aortic aneurysm) Brother    Family Psychiatric  History: Denies knowing of any family history Social History:  History  Alcohol Use No     History  Drug Use  . Types: Marijuana    Social History   Social History  . Marital status: Single    Spouse name: N/A  . Number of children: N/A  . Years of education: N/A   Social History Main Topics  . Smoking status: Current Every Day Smoker    Types: Cigars  . Smokeless tobacco: Never Used  . Alcohol use No  . Drug use: Yes    Types: Marijuana  . Sexual activity: Yes    Partners: Female    Birth control/ protection: None   Other Topics Concern  . None   Social History Narrative  . None   Additional Social History:    Allergies:   Allergies  Allergen Reactions  . Penicillins Other (See Comments)    Has patient had a PCN reaction causing immediate rash, facial/tongue/throat swelling, SOB or lightheadedness with hypotension: Unknown Has patient had a PCN reaction causing severe rash involving mucus membranes or skin necrosis: Unknown Has patient had a PCN  reaction that required hospitalization: Unknown Has patient had a PCN reaction occurring within the last 10 years: Unknown If all of the above answers are "NO", then may proceed with Cephalosporin use.   . Tramadol Hcl     Labs:  Results for orders placed or performed during the hospital encounter of 09/19/17 (from the past 48 hour(s))  Comprehensive metabolic panel     Status: Abnormal   Collection Time: 09/19/17  9:44 AM  Result Value Ref Range   Sodium 138 135 - 145 mmol/L   Potassium 4.0 3.5 - 5.1 mmol/L   Chloride 104 101 - 111 mmol/L   CO2 27 22 - 32 mmol/L   Glucose, Bld 102 (H) 65 - 99 mg/dL   BUN 8 6 - 20 mg/dL   Creatinine, Ser 0.79 0.61 - 1.24 mg/dL   Calcium 9.2 8.9 - 10.3 mg/dL   Total Protein 6.7 6.5 - 8.1 g/dL   Albumin 4.0 3.5 - 5.0 g/dL   AST 23 15 - 41 U/L  ALT 16 (L) 17 - 63 U/L   Alkaline Phosphatase 72 38 - 126 U/L   Total Bilirubin 1.2 0.3 - 1.2 mg/dL   GFR calc non Af Amer >60 >60 mL/min   GFR calc Af Amer >60 >60 mL/min    Comment: (NOTE) The eGFR has been calculated using the CKD EPI equation. This calculation has not been validated in all clinical situations. eGFR's persistently <60 mL/min signify possible Chronic Kidney Disease.    Anion gap 7 5 - 15  Ethanol     Status: None   Collection Time: 09/19/17  9:44 AM  Result Value Ref Range   Alcohol, Ethyl (B) <10 <10 mg/dL    Comment:        LOWEST DETECTABLE LIMIT FOR SERUM ALCOHOL IS 10 mg/dL FOR MEDICAL PURPOSES ONLY   Salicylate level     Status: None   Collection Time: 09/19/17  9:44 AM  Result Value Ref Range   Salicylate Lvl <6.7 2.8 - 30.0 mg/dL  Acetaminophen level     Status: Abnormal   Collection Time: 09/19/17  9:44 AM  Result Value Ref Range   Acetaminophen (Tylenol), Serum <10 (L) 10 - 30 ug/mL    Comment:        THERAPEUTIC CONCENTRATIONS VARY SIGNIFICANTLY. A RANGE OF 10-30 ug/mL MAY BE AN EFFECTIVE CONCENTRATION FOR MANY PATIENTS. HOWEVER, SOME ARE BEST TREATED AT  CONCENTRATIONS OUTSIDE THIS RANGE. ACETAMINOPHEN CONCENTRATIONS >150 ug/mL AT 4 HOURS AFTER INGESTION AND >50 ug/mL AT 12 HOURS AFTER INGESTION ARE OFTEN ASSOCIATED WITH TOXIC REACTIONS.   cbc     Status: Abnormal   Collection Time: 09/19/17  9:44 AM  Result Value Ref Range   WBC 4.8 3.8 - 10.6 K/uL   RBC 5.24 4.40 - 5.90 MIL/uL   Hemoglobin 16.2 13.0 - 18.0 g/dL   HCT 48.3 40.0 - 52.0 %   MCV 92.2 80.0 - 100.0 fL   MCH 30.9 26.0 - 34.0 pg   MCHC 33.5 32.0 - 36.0 g/dL   RDW 13.4 11.5 - 14.5 %   Platelets 148 (L) 150 - 440 K/uL  Urine Drug Screen, Qualitative     Status: Abnormal   Collection Time: 09/19/17  9:44 AM  Result Value Ref Range   Tricyclic, Ur Screen NONE DETECTED NONE DETECTED   Amphetamines, Ur Screen NONE DETECTED NONE DETECTED   MDMA (Ecstasy)Ur Screen NONE DETECTED NONE DETECTED   Cocaine Metabolite,Ur [place] NONE DETECTED NONE DETECTED   Opiate, Ur Screen NONE DETECTED NONE DETECTED   Phencyclidine (PCP) Ur S NONE DETECTED NONE DETECTED   Cannabinoid 50 Ng, Ur [place] POSITIVE (A) NONE DETECTED   Barbiturates, Ur Screen NONE DETECTED NONE DETECTED   Benzodiazepine, Ur Scrn NONE DETECTED NONE DETECTED   Methadone Scn, Ur NONE DETECTED NONE DETECTED    Comment: (NOTE) 124  Tricyclics, urine               Cutoff 1000 ng/mL 200  Amphetamines, urine             Cutoff 1000 ng/mL 300  MDMA (Ecstasy), urine           Cutoff 500 ng/mL 400  Cocaine Metabolite, urine       Cutoff 300 ng/mL 500  Opiate, urine                   Cutoff 300 ng/mL 600  Phencyclidine (PCP), urine      Cutoff 25 ng/mL 700  Cannabinoid, urine  Cutoff 50 ng/mL 800  Barbiturates, urine             Cutoff 200 ng/mL 900  Benzodiazepine, urine           Cutoff 200 ng/mL 1000 Methadone, urine                Cutoff 300 ng/mL 1100 1200 The urine drug screen provides only a preliminary, unconfirmed 1300 analytical test result and should not be used for non-medical 1400 purposes. Clinical  consideration and professional judgment should 1500 be applied to any positive drug screen result due to possible 1600 interfering substances. A more specific alternate chemical method 1700 must be used in order to obtain a confirmed analytical result.  1800 Gas chromato graphy / mass spectrometry (GC/MS) is the preferred 1900 confirmatory method.     No current facility-administered medications for this encounter.    No current outpatient prescriptions on file.    Musculoskeletal: Strength & Muscle Tone: within normal limits Gait & Station: normal Patient leans: N/A  Psychiatric Specialty Exam: Physical Exam  Nursing note and vitals reviewed. Constitutional: He appears well-developed and well-nourished.  HENT:  Head: Normocephalic and atraumatic.  Eyes: Pupils are equal, round, and reactive to light. Conjunctivae are normal.  Neck: Normal range of motion.  Cardiovascular: Regular rhythm and normal heart sounds.   Respiratory: Effort normal. No respiratory distress.  GI: Soft.  Musculoskeletal: Normal range of motion.  Neurological: He is alert.  Skin: Skin is warm and dry.  Psychiatric: He has a normal mood and affect. His behavior is normal. Judgment and thought content normal.    Review of Systems  Constitutional: Negative.   HENT: Negative.   Eyes: Negative.   Respiratory: Negative.   Cardiovascular: Negative.   Gastrointestinal: Negative.   Musculoskeletal: Negative.   Skin: Negative.   Neurological: Negative.   Psychiatric/Behavioral: Negative for depression, hallucinations, memory loss, substance abuse and suicidal ideas. The patient is not nervous/anxious and does not have insomnia.     Blood pressure 133/86, pulse 82, temperature 98.2 F (36.8 C), temperature source Oral, resp. rate 17, weight 81.6 kg (180 lb), SpO2 98 %.Body mass index is 25.1 kg/m.  General Appearance: Casual  Eye Contact:  Fair  Speech:  Clear and Coherent  Volume:  Normal  Mood:   Euthymic  Affect:  Appropriate  Thought Process:  Goal Directed  Orientation:  Full (Time, Place, and Person)  Thought Content:  Logical  Suicidal Thoughts:  No  Homicidal Thoughts:  No  Memory:  Immediate;   Good Recent;   Good Remote;   Good  Judgement:  Fair  Insight:  Fair  Psychomotor Activity:  Normal  Concentration:  Concentration: Fair  Recall:  [place] of Knowledge:  Fair  Language:  Fair  Akathisia:  No  Handed:  Right  AIMS (if indicated):     Assets:  Desire for Improvement Physical Health  ADL's:  Intact  Cognition:  WNL  Sleep:        Treatment Plan Summary: Plan 21 year old man who denies that he actually had any thought or plan of harming himself.  Affect upbeat and euthymic.  Patient energetic but not manic.  No sign of psychosis.  Does not appear to be particularly motivated for substance abuse treatment.  Patient no longer meets commitment criteria.  Does not need to stay in the emergency room.  Strongly encouraged him to follow up with the plans to go to [place].  Patient taken off  of IVC and can be released from the emergency room.  Disposition: No evidence of imminent risk to self or others at present.   Patient does not meet criteria for psychiatric inpatient admission.  Alethia Berthold, MD 09/19/2017 9:07 PM
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
## 2017-11-27 ENCOUNTER — Emergency Department
Admission: EM | Admit: 2017-11-27 | Discharge: 2017-11-27 | Disposition: A | Discharge status: Home / Self Care | Payer: Medicaid Other | Attending: Emergency Medicine | Admitting: Emergency Medicine

## 2017-11-27 ENCOUNTER — Other Ambulatory Visit: Payer: Self-pay

## 2017-11-27 ENCOUNTER — Encounter: Payer: Self-pay | Admitting: Emergency Medicine

## 2017-11-27 DIAGNOSIS — F17290 Nicotine dependence, other tobacco product, uncomplicated: Secondary | ICD-10-CM | POA: Diagnosis not present

## 2017-11-27 DIAGNOSIS — R112 Nausea with vomiting, unspecified: Secondary | ICD-10-CM | POA: Diagnosis present

## 2017-11-27 DIAGNOSIS — K529 Noninfective gastroenteritis and colitis, unspecified: Secondary | ICD-10-CM | POA: Diagnosis not present

## 2017-11-27 LAB — COMPREHENSIVE METABOLIC PANEL
ALBUMIN: 4 g/dL (ref 3.5–5.0)
ALT: 20 U/L (ref 17–63)
ANION GAP: 8 (ref 5–15)
AST: 32 U/L (ref 15–41)
Alkaline Phosphatase: 84 U/L (ref 38–126)
BILIRUBIN TOTAL: 0.9 mg/dL (ref 0.3–1.2)
BUN: 14 mg/dL (ref 6–20)
CALCIUM: 9.1 mg/dL (ref 8.9–10.3)
CO2: 26 mmol/L (ref 22–32)
CREATININE: 0.87 mg/dL (ref 0.61–1.24)
Chloride: 108 mmol/L (ref 101–111)
GFR calc Af Amer: >60 mL/min (ref >60)
GFR calc non Af Amer: >60 mL/min (ref >60)
GLUCOSE: 109 mg/dL — AB (ref 65–99)
Potassium: 4 mmol/L (ref 3.5–5.1)
SODIUM: 142 mmol/L (ref 135–145)
Total Protein: 6.6 g/dL (ref 6.5–8.1)

## 2017-11-27 LAB — INFLUENZA PANEL BY PCR (TYPE A & B)
INFLAPCR: NEGATIVE
Influenza B By PCR: NEGATIVE

## 2017-11-27 LAB — CBC
HCT: 50.4 % (ref 40.0–52.0)
Hemoglobin: 17.3 g/dL (ref 13.0–18.0)
MCH: 31.8 pg (ref 26.0–34.0)
MCHC: 34.4 g/dL (ref 32.0–36.0)
MCV: 92.6 fL (ref 80.0–100.0)
PLATELETS: 206 10*3/uL (ref 150–440)
RBC: 5.44 MIL/uL (ref 4.40–5.90)
RDW: 12.8 % (ref 11.5–14.5)
WBC: 7 10*3/uL (ref 3.8–10.6)

## 2017-11-27 LAB — LIPASE, BLOOD: Lipase: 37 U/L (ref 11–51)

## 2017-11-27 MED ORDER — ONDANSETRON 4 MG PO TBDP
4.0000 mg | ORAL_TABLET | Freq: Once | ORAL | Status: AC | PRN
  Administered 2017-11-27: 4 mg via ORAL
  Filled 2017-11-27: qty 1

## 2017-11-27 MED ORDER — ONDANSETRON HCL 4 MG/2ML IJ SOLN
4.0000 mg | Freq: Once | INTRAMUSCULAR | Status: AC
  Administered 2017-11-27: 4 mg via INTRAVENOUS
  Filled 2017-11-27: qty 2

## 2017-11-27 MED ORDER — ONDANSETRON 4 MG PO TBDP: 4.0000 mg | ORAL_TABLET | Freq: Three times a day (TID) | ORAL | 0 refills | Status: DC | PRN

## 2017-11-27 MED ORDER — DICYCLOMINE HCL 10 MG PO CAPS
ORAL_CAPSULE | ORAL | Status: AC
  Administered 2017-11-27: 10 mg via ORAL
  Filled 2017-11-27: qty 1

## 2017-11-27 MED ORDER — DICYCLOMINE HCL 10 MG PO CAPS
10.0000 mg | ORAL_CAPSULE | Freq: Once | ORAL | Status: AC
  Administered 2017-11-27: 10 mg via ORAL

## 2017-11-27 MED ORDER — SODIUM CHLORIDE 0.9 % IV SOLN
1000.0000 mL | Freq: Once | INTRAVENOUS | Status: AC
  Administered 2017-11-27: 1000 mL via INTRAVENOUS
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
## 2017-11-27 NOTE — ED Triage Notes (Signed)
Pt to ED c/o abdominal pain and vomiting x 2 days. Pt states that he has vomited 5 x in the last 24 hours. Pt states that he is not able to keep fluids down. Pt in NAD at this time.

## 2017-12-08 ENCOUNTER — Emergency Department
Admission: EM | Admit: 2017-12-08 | Discharge: 2017-12-08 | Disposition: A | Discharge status: Home / Self Care | Payer: Medicaid Other | Attending: Emergency Medicine | Admitting: Emergency Medicine

## 2017-12-08 ENCOUNTER — Emergency Department: Payer: Medicaid Other

## 2017-12-08 DIAGNOSIS — F1210 Cannabis abuse, uncomplicated: Secondary | ICD-10-CM | POA: Insufficient documentation

## 2017-12-08 DIAGNOSIS — F4325 Adjustment disorder with mixed disturbance of emotions and conduct: Secondary | ICD-10-CM | POA: Insufficient documentation

## 2017-12-08 DIAGNOSIS — R1031 Right lower quadrant pain: Secondary | ICD-10-CM | POA: Diagnosis present

## 2017-12-08 DIAGNOSIS — F17290 Nicotine dependence, other tobacco product, uncomplicated: Secondary | ICD-10-CM | POA: Insufficient documentation

## 2017-12-08 DIAGNOSIS — F909 Attention-deficit hyperactivity disorder, unspecified type: Secondary | ICD-10-CM | POA: Insufficient documentation

## 2017-12-08 DIAGNOSIS — R109 Unspecified abdominal pain: Secondary | ICD-10-CM

## 2017-12-08 LAB — COMPREHENSIVE METABOLIC PANEL
ALT: 24 U/L (ref 17–63)
AST: 26 U/L (ref 15–41)
Albumin: 4.3 g/dL (ref 3.5–5.0)
Alkaline Phosphatase: 99 U/L (ref 38–126)
Anion gap: 8 (ref 5–15)
BUN: 16 mg/dL (ref 6–20)
CO2: 26 mmol/L (ref 22–32)
Calcium: 9.1 mg/dL (ref 8.9–10.3)
Chloride: 107 mmol/L (ref 101–111)
Creatinine, Ser: 1.04 mg/dL (ref 0.61–1.24)
GFR calc Af Amer: >60 mL/min (ref >60)
GFR calc non Af Amer: >60 mL/min (ref >60)
Glucose, Bld: 87 mg/dL (ref 65–99)
Potassium: 3.8 mmol/L (ref 3.5–5.1)
Sodium: 141 mmol/L (ref 135–145)
Total Bilirubin: 1.1 mg/dL (ref 0.3–1.2)
Total Protein: 7.1 g/dL (ref 6.5–8.1)

## 2017-12-08 LAB — URINALYSIS, COMPLETE (UACMP) WITH MICROSCOPIC
BILIRUBIN URINE: NEGATIVE
Bacteria, UA: NONE SEEN
GLUCOSE, UA: NEGATIVE mg/dL
HGB URINE DIPSTICK: NEGATIVE
Ketones, ur: NEGATIVE mg/dL
LEUKOCYTES UA: NEGATIVE
NITRITE: NEGATIVE
PROTEIN: NEGATIVE mg/dL
Specific Gravity, Urine: 1.019 (ref 1.005–1.030)
pH: 7 (ref 5.0–8.0)

## 2017-12-08 LAB — CBC
HEMATOCRIT: 48.4 % (ref 40.0–52.0)
Hemoglobin: 16.4 g/dL (ref 13.0–18.0)
MCH: 31.4 pg (ref 26.0–34.0)
MCHC: 33.9 g/dL (ref 32.0–36.0)
MCV: 92.7 fL (ref 80.0–100.0)
Platelets: 186 10*3/uL (ref 150–440)
RBC: 5.22 MIL/uL (ref 4.40–5.90)
RDW: 12.8 % (ref 11.5–14.5)
WBC: 7.3 10*3/uL (ref 3.8–10.6)

## 2017-12-08 LAB — CHLAMYDIA/NGC RT PCR (ARMC ONLY)
Chlamydia Tr: NOT DETECTED
N gonorrhoeae: NOT DETECTED

## 2017-12-08 MED ORDER — IBUPROFEN 400 MG PO TABS
600.0000 mg | ORAL_TABLET | Freq: Once | ORAL | Status: AC
  Administered 2017-12-08: 600 mg via ORAL
  Filled 2017-12-08: qty 2

## 2017-12-08 NOTE — ED Triage Notes (Signed)
Pt reports having stabbing and throbbing pain in his right flank area with pain during urination.  Pt denies recent sexual activity without a condom, but does state that he has been having dark yellow drainage also coming from penis.  Pt is A&Ox4.

## 2017-12-08 NOTE — ED Provider Notes (Signed)
[facility] Emergency Department Provider Note  ____________________________________________  Time seen: Approximately 4:59 PM  I have reviewed the triage vital signs and the nursing notes.   HISTORY  Chief Complaint Flank Pain and Urinary Tract Infection   HPI Michael [name] is a 22 y.o. male who presents for evaluation of right flank pain. Patient reports one week of sharp moderate constant pain in the right flank. Patient reports that the pain is worse when he urinates but denies dysuria, frequency, or hematuria. He reports that he noted yesterday a liquid yellow discharge from his penis however that has also resolved. He reports that the last time he had sexual intercourse was 3 months ago when he work condom. He denies prior history of STDs. He denies fever or chills, nausea or vomiting, diarrhea or constipation, history of kidney stones.  Past Medical History:  Diagnosis Date  . ADHD (attention deficit hyperactivity disorder)   . Allergy   . Chronic headache   . GERD (gastroesophageal reflux disease)   . PTSD (post-traumatic stress disorder)     Patient Active Problem List   Diagnosis Date Noted  . 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 Suicidal ideation 09/19/2017  . Cannabis abuse 09/19/2017  . Hx of tympanostomy tubes 09/17/2015  . History of ADHD 05/09/2015  . Acne 05/09/2015  . Esophageal reflux 05/09/2015  . Personal history of traumatic brain injury 05/09/2015  . 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 Posttraumatic stress disorder 05/09/2015  . Chromophytosis 05/09/2015    Past Surgical History:  Procedure Laterality Date  . TONSILLECTOMY AND ADENOIDECTOMY      Prior to Admission medications   Medication Sig Start Date End Date Taking? Authorizing Provider  ondansetron (ZOFRAN ODT) 4 MG disintegrating tablet Take 1 tablet (4 mg total) by mouth every 8 (eight) hours as  needed for nausea or vomiting. 11/27/17   Jene Every, MD    Allergies Penicillins and Tramadol hcl  Family History  Problem Relation Age of Onset  . Depression Mother   . Hyperlipidemia Mother   . Arthritis Mother   . Mitral valve prolapse Mother   . Alcohol abuse Father   . Epilepsy Brother   . AAA (abdominal aortic aneurysm) Brother     Social History Social History   Tobacco Use  . Smoking status: Current Every Day Smoker    Types: Cigars  . Smokeless tobacco: Never Used  Substance Use Topics  . Alcohol use: No    Alcohol/week: 0.0 oz  . Drug use: Yes    Types: Marijuana    Review of Systems  Constitutional: Negative for fever. Eyes: Negative for visual changes. ENT: Negative for sore throat. Neck: No neck pain  Cardiovascular: Negative for chest pain. Respiratory: Negative for shortness of breath. Gastrointestinal: Negative for abdominal pain, vomiting or diarrhea. Genitourinary: Negative for dysuria. + R flank pain and penile discharge Musculoskeletal: Negative for back pain. Skin: Negative for rash. Neurological: Negative for headaches, weakness or numbness. Psych: No SI or HI  ____________________________________________   PHYSICAL EXAM:  VITAL SIGNS: ED Triage Vitals  Enc Vitals Group     BP 12/08/17 1602 125/79     Pulse Rate 12/08/17 1602 75     Resp 12/08/17 1602 18     Temp 12/08/17 1602 99 F (37.2 C)     Temp Source 12/08/17 1602 Oral     SpO2 12/08/17 1602 100 %     Weight 12/08/17  1609 175 lb (79.4 kg)     Height --      Head Circumference --      Peak Flow --      Pain Score 12/08/17 1602 10     Pain Loc --      Pain Edu? --      Excl. in GC? --     Constitutional: Alert and oriented. Well appearing and in no apparent distress. HEENT:      Head: Normocephalic and atraumatic.         Eyes: Conjunctivae are normal. Sclera is non-icteric.       Mouth/Throat: Mucous membranes are moist.       Neck: Supple with no signs of  meningismus. Cardiovascular: Regular rate and rhythm. No murmurs, gallops, or rubs. 2+ symmetrical distal pulses are present in all extremities. No JVD. Respiratory: Normal respiratory effort. Lungs are clear to auscultation bilaterally. No wheezes, crackles, or rhonchi.  Gastrointestinal: Soft, non tender, and non distended with positive bowel sounds. No rebound or guarding. Genitourinary: R CVA tenderness. Bilateral testicles are descended with no tenderness to palpation, bilateral positive cremasteric reflexes are present, no swelling or erythema of the scrotum. No evidence of inguinal hernia. Musculoskeletal: Nontender with normal range of motion in all extremities. No edema, cyanosis, or erythema of extremities. Neurologic: Normal speech and language. Face is symmetric. Moving all extremities. No gross focal neurologic deficits are appreciated. Skin: Skin is warm, dry and intact. No rash noted. Psychiatric: Mood and affect are normal. Speech and behavior are normal.  ____________________________________________   LABS (all labs ordered are listed, but only abnormal results are displayed)  Labs Reviewed  URINALYSIS, COMPLETE (UACMP) WITH MICROSCOPIC - Abnormal; Notable for the following components:      Result Value   Color, Urine YELLOW (*)    APPearance CLEAR (*)    Squamous Epithelial / LPF 0-5 (*)    All other components within normal limits  CHLAMYDIA/NGC RT PCR ([facility] ONLY)  CBC  COMPREHENSIVE METABOLIC PANEL   ____________________________________________  EKG  none ____________________________________________  RADIOLOGY  CT renal: Nonobstructing stone identified within the right kidney. No hydronephrosis bilaterally.  1.3 mm appendicular is identified without inflammation surrounding the appendix.  ____________________________________________   PROCEDURES  Procedure(s) performed: None Procedures Critical Care performed:   None ____________________________________________   INITIAL IMPRESSION / ASSESSMENT AND PLAN / ED COURSE   22 y.o. male who presents for evaluation of right flank pain. Patient reports one week of sharp moderate constant pain in the right flank. Patient has CVA tenderness, normal vital signs, normal UA with no blood or infection, normal white count, normal  CMP. Gc/chlmaydia pending. Ddx STD vs kidney stone. CT renal pending. Will give ibuprofen for pain    _________________________ 6:21 PM on 12/08/2017 -----------------------------------------  CT showing a nonobstructing stone in the right kidney with no evidence of hydronephrosis. No hematuria, no UTI, labs are within normal limits, no STD. CT showing an appendicolith but no evidence of appendicitis. Patient has no tenderness to palpation on the right lower quadrant, normal temperature, and normal white count. Clinically no evidence of appendicitis. I discussed with the patient that he could have a very small stone that was missed in one of the cuts of the CT and it could be the reason of his pain. Recommended ibuprofen at home and follow-up. Discussed return precautions and recommended that he return to the emergency room if the pain moves to his abdomen, if he has nausea, vomiting, fever, or  dysuria.   As part of my medical decision making, I reviewed the following data within the electronic MEDICAL RECORD NUMBER Nursing notes reviewed and incorporated, Labs reviewed , Radiograph reviewed , Notes from prior ED visits and [place] Controlled Substance Database    Pertinent labs & imaging results that were available during my care of the patient were reviewed by me and considered in my medical decision making (see chart for details).    ____________________________________________   FINAL CLINICAL IMPRESSION(S) / ED DIAGNOSES  Final diagnoses:  Right flank pain      NEW MEDICATIONS STARTED DURING THIS VISIT:  ED Discharge Orders    None        Note:  This document was prepared using Dragon voice recognition software and may include unintentional dictation errors.    Nita Sickle, MD 12/08/17 [phone]

## 2017-12-21 ENCOUNTER — Ambulatory Visit: Payer: Medicaid Other | Admitting: Family Medicine

## 2017-12-21 ENCOUNTER — Encounter: Payer: Self-pay | Admitting: Family Medicine

## 2017-12-21 VITALS — BP 120/84 | HR 98 | Temp 98.0°F | Resp 16 | Wt 181.0 lb

## 2017-12-21 DIAGNOSIS — L739 Follicular disorder, unspecified: Secondary | ICD-10-CM | POA: Diagnosis not present

## 2017-12-21 MED ORDER — SULFAMETHOXAZOLE-TRIMETHOPRIM 800-160 MG PO TABS: 1.0000 | ORAL_TABLET | Freq: Two times a day (BID) | ORAL | 0 refills | Status: AC

## 2017-12-21 NOTE — Progress Notes (Signed)
       Patient: Michael [name] Male    DOB: 01/07/1996   22 y.o.   MRN: [id number] Visit Date: 12/21/2017  Today's Provider: Mila Merryonald Fisher, MD   No chief complaint on file.  Subjective:    Patient has a sore on underside of his chin x2 days. Patient states sore looked like a pimple at first until he mashed it, then the sore got bigger, red and has been draining. Patient has been using alcohol and neosporin on the sore.         Allergies  Allergen Reactions  . Penicillins Other (See Comments)    Has patient had a PCN reaction causing immediate rash, facial/tongue/throat swelling, SOB or lightheadedness with hypotension: Unknown Has patient had a PCN reaction causing severe rash involving mucus membranes or skin necrosis: Unknown Has patient had a PCN reaction that required hospitalization: Unknown Has patient had a PCN reaction occurring within the last 10 years: Unknown If all of the above answers are "NO", then may proceed with Cephalosporin use.   . Tramadol Hcl      Current Outpatient Medications:  .  ondansetron (ZOFRAN ODT) 4 MG disintegrating tablet, Take 1 tablet (4 mg total) by mouth every 8 (eight) hours as needed for nausea or vomiting. (Patient not taking: Reported on 12/21/2017), Disp: 20 tablet, Rfl: 0  Review of Systems  Constitutional: Negative for appetite change, chills and fever.  Respiratory: Negative for chest tightness, shortness of breath and wheezing.   Cardiovascular: Negative for chest pain and palpitations.  Gastrointestinal: Negative for abdominal pain, nausea and vomiting.    Social History   Tobacco Use  . Smoking status: Current Every Day Smoker    Types: Cigars  . Smokeless tobacco: Never Used  Substance Use Topics  . Alcohol use: No    Alcohol/week: 0.0 oz   Objective:   BP 120/84 (BP Location: Right Arm, Patient Position: Sitting, Cuff Size: Normal)   Pulse 98   Temp 98 F (36.7 C) (Oral)   Resp 16   Wt 181 lb (82.1 kg)   SpO2  95%   BMI 25.24 kg/m  Vitals:   12/21/17 1614  BP: 120/84  Pulse: 98  Resp: 16  Temp: 98 F (36.7 C)  TempSrc: Oral  SpO2: 95%  Weight: 181 lb (82.1 kg)     Physical Exam  Red swollen follicular mass under anterior chin with small amount purulent discharge.     Assessment & Plan:     1. Acute folliculitis  - sulfamethoxazole-trimethoprim (BACTRIM DS,SEPTRA DS) 800-160 MG tablet; Take 1 tablet by mouth 2 (two) times daily for 14 days.  Dispense: 28 tablet; Refill: 0 - WOUND CULTURE       Mila Merryonald Fisher, MD  [facility]

## 2017-12-27 LAB — WOUND CULTURE: Organism ID, Bacteria: NONE SEEN

## 2018-01-03 ENCOUNTER — Other Ambulatory Visit: Payer: Self-pay

## 2018-01-03 ENCOUNTER — Emergency Department
Admission: EM | Admit: 2018-01-03 | Discharge: 2018-01-03 | Disposition: A | Discharge status: Home / Self Care | Payer: Medicaid Other | Attending: Emergency Medicine | Admitting: Emergency Medicine

## 2018-01-03 DIAGNOSIS — R112 Nausea with vomiting, unspecified: Secondary | ICD-10-CM | POA: Diagnosis not present

## 2018-01-03 DIAGNOSIS — R197 Diarrhea, unspecified: Secondary | ICD-10-CM | POA: Diagnosis not present

## 2018-01-03 DIAGNOSIS — R1110 Vomiting, unspecified: Secondary | ICD-10-CM

## 2018-01-03 DIAGNOSIS — R109 Unspecified abdominal pain: Secondary | ICD-10-CM | POA: Diagnosis present

## 2018-01-03 DIAGNOSIS — F17290 Nicotine dependence, other tobacco product, uncomplicated: Secondary | ICD-10-CM | POA: Insufficient documentation

## 2018-01-03 LAB — COMPREHENSIVE METABOLIC PANEL
ALK PHOS: 100 U/L (ref 38–126)
ALT: 34 U/L (ref 17–63)
ANION GAP: 7 (ref 5–15)
AST: 33 U/L (ref 15–41)
Albumin: 4.6 g/dL (ref 3.5–5.0)
BUN: 11 mg/dL (ref 6–20)
CALCIUM: 9.6 mg/dL (ref 8.9–10.3)
CO2: 24 mmol/L (ref 22–32)
Chloride: 106 mmol/L (ref 101–111)
Creatinine, Ser: 0.88 mg/dL (ref 0.61–1.24)
GFR calc Af Amer: >60 mL/min (ref >60)
Glucose, Bld: 97 mg/dL (ref 65–99)
POTASSIUM: 4.2 mmol/L (ref 3.5–5.1)
Sodium: 137 mmol/L (ref 135–145)
TOTAL PROTEIN: 7.8 g/dL (ref 6.5–8.1)
Total Bilirubin: 1 mg/dL (ref 0.3–1.2)

## 2018-01-03 LAB — URINALYSIS, COMPLETE (UACMP) WITH MICROSCOPIC
BACTERIA UA: NONE SEEN
Bilirubin Urine: NEGATIVE
GLUCOSE, UA: NEGATIVE mg/dL
HGB URINE DIPSTICK: NEGATIVE
KETONES UR: NEGATIVE mg/dL
LEUKOCYTES UA: NEGATIVE
NITRITE: NEGATIVE
PH: 5 (ref 5.0–8.0)
Protein, ur: NEGATIVE mg/dL
RBC / HPF: NONE SEEN RBC/hpf (ref 0–5)
SPECIFIC GRAVITY, URINE: 1.011 (ref 1.005–1.030)
Squamous Epithelial / LPF: NONE SEEN

## 2018-01-03 LAB — CBC
HEMATOCRIT: 50.5 % (ref 40.0–52.0)
Hemoglobin: 17.2 g/dL (ref 13.0–18.0)
MCH: 31.1 pg (ref 26.0–34.0)
MCHC: 34 g/dL (ref 32.0–36.0)
MCV: 91.5 fL (ref 80.0–100.0)
Platelets: 194 10*3/uL (ref 150–440)
RBC: 5.52 MIL/uL (ref 4.40–5.90)
RDW: 12.6 % (ref 11.5–14.5)
WBC: 6.7 10*3/uL (ref 3.8–10.6)

## 2018-01-03 LAB — LIPASE, BLOOD: Lipase: 31 U/L (ref 11–51)

## 2018-01-03 MED ORDER — SUCRALFATE 1 G PO TABS
1.0000 g | ORAL_TABLET | Freq: Once | ORAL | Status: AC
  Administered 2018-01-03: 1 g via ORAL
  Filled 2018-01-03 (×2): qty 1

## 2018-01-03 MED ORDER — SUCRALFATE 1 G PO TABS: 1.0000 g | ORAL_TABLET | Freq: Four times a day (QID) | ORAL | 1 refills | Status: DC

## 2018-01-03 MED ORDER — ONDANSETRON 4 MG PO TBDP
4.0000 mg | ORAL_TABLET | Freq: Once | ORAL | Status: AC
  Administered 2018-01-03: 4 mg via ORAL
  Filled 2018-01-03: qty 1

## 2018-01-03 MED ORDER — ONDANSETRON HCL 4 MG PO TABS: 4.0000 mg | ORAL_TABLET | Freq: Three times a day (TID) | ORAL | 0 refills | Status: DC | PRN

## 2018-01-03 MED ORDER — ACETAMINOPHEN 325 MG PO TABS
650.0000 mg | ORAL_TABLET | Freq: Once | ORAL | Status: AC
  Administered 2018-01-03: 650 mg via ORAL
  Filled 2018-01-03: qty 2

## 2018-01-03 NOTE — ED Notes (Addendum)
Pt mother called about patient, informed that I cannot given any information regarding patient. Informed patient that mother called. Pt calling her now.   Pt up and down to bathroom X 2 since being placed in room.

## 2018-01-03 NOTE — Discharge Instructions (Signed)
You are evaluated for nausea vomiting and watery diarrhea, and although no certain cause was found, your exam and evaluation are overall reassuring in the emerge department today.  If you have a bowel movement, you may collect it and taken to primary care doctor's office for lab analysis.  I am referring you and you should call for a gastroenterology follow-up given ongoing GI symptoms.  Return to emerge department immediately for any fever, black or bloody stools, worsening or uncontrolled pain, vomiting blood, dizziness or passing out, or any other symptoms concerning to you.

## 2018-01-03 NOTE — ED Notes (Signed)
First Nurse Note:  Patient called for Triage, he was in [facility].

## 2018-01-03 NOTE — ED Notes (Signed)
ED Provider at bedside. 

## 2018-01-03 NOTE — ED Notes (Signed)
Reports nausea, diarrhea and abdominal pain that originates in lower abdomen and radiates upwards X 7 days. Says similar sx in recent hx which he sought medical attention for. Pt was able to eat a banana this AM, no emesis. Watery diarrhea. Pt alert and oriented X4, active, cooperative, pt in NAD. RR even and unlabored, color WNL.

## 2018-01-03 NOTE — ED Triage Notes (Signed)
p c/o watery diarrhea for the past month with nausea. Pt sitting in the WR eating crackers. Denies vomiting. States he was seen here twice for similar sx.

## 2018-01-03 NOTE — ED Notes (Signed)
First Nurse Note:  Patient complaining of abdominal pain with diarrhea.  Alert and oriented.

## 2018-01-03 NOTE — ED Notes (Signed)
Pt alert and oriented X4, active, cooperative, pt in NAD. RR even and unlabored, color WNL.  Pt informed to return if any life threatening symptoms occur.  Discharge and followup instructions reviewed.  

## 2018-01-03 NOTE — ED Provider Notes (Signed)
[facility] Emergency Department Provider Note ____________________________________________   I have reviewed the triage vital signs and the triage nursing note.  HISTORY  Chief Complaint Abdominal Pain   Historian Patient  HPI Michael [name] is a 22 y.o. male with history of gerd presents for eval for abdominal cramping and watery nonbloody diarrhea x about 1 month.  Cramping is all over and moderate to severe at times.  Not focal to one location, moves around or is located all over.  No hemetemesis.  Seen at least once in ER in [place] for same symptoms.  No fever.  Staying at [facility], does have PCP.    Past Medical History:  Diagnosis Date  . ADHD (attention deficit hyperactivity disorder)   . Allergy   . Chronic headache   . GERD (gastroesophageal reflux disease)   . PTSD (post-traumatic stress disorder)     Patient Active Problem List   Diagnosis Date Noted  . 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 Suicidal ideation 09/19/2017  . Cannabis abuse 09/19/2017  . Hx of tympanostomy tubes 09/17/2015  . History of ADHD 05/09/2015  . Acne 05/09/2015  . Esophageal reflux 05/09/2015  . Personal history of traumatic brain injury 05/09/2015  . 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 Posttraumatic stress disorder 05/09/2015  . Chromophytosis 05/09/2015    Past Surgical History:  Procedure Laterality Date  . TONSILLECTOMY AND ADENOIDECTOMY      Prior to Admission medications   Medication Sig Start Date End Date Taking? Authorizing Provider  ondansetron (ZOFRAN ODT) 4 MG disintegrating tablet Take 1 tablet (4 mg total) by mouth every 8 (eight) hours as needed for nausea or vomiting. Patient not taking: Reported on 12/21/2017 11/27/17   Jene Every, MD  ondansetron (ZOFRAN) 4 MG tablet Take 1 tablet (4 mg total) by mouth every 8 (eight) hours as needed for nausea or vomiting.  01/03/18   Governor Rooks, MD  sucralfate (CARAFATE) 1 g tablet Take 1 tablet (1 g total) by mouth 4 (four) times daily. 01/03/18 01/03/19  Governor Rooks, MD  sulfamethoxazole-trimethoprim (BACTRIM DS,SEPTRA DS) 800-160 MG tablet Take 1 tablet by mouth 2 (two) times daily for 14 days. 12/21/17 01/04/18  Malva Limes, MD    Allergies  Allergen Reactions  . Penicillins Other (See Comments)    Has patient had a PCN reaction causing immediate rash, facial/tongue/throat swelling, SOB or lightheadedness with hypotension: Unknown Has patient had a PCN reaction causing severe rash involving mucus membranes or skin necrosis: Unknown Has patient had a PCN reaction that required hospitalization: Unknown Has patient had a PCN reaction occurring within the last 10 years: Unknown If all of the above answers are "NO", then may proceed with Cephalosporin use.   . Tramadol Hcl     Family History  Problem Relation Age of Onset  . Depression Mother   . Hyperlipidemia Mother   . Arthritis Mother   . Mitral valve prolapse Mother   . Alcohol abuse Father   . Epilepsy Brother   . AAA (abdominal aortic aneurysm) Brother     Social History Social History   Tobacco Use  . Smoking status: Current Every Day Smoker    Types: Cigars  . Smokeless tobacco: Never Used  Substance Use Topics  . Alcohol use: No    Alcohol/week: 0.0 oz  . Drug use: Yes    Types: Marijuana    Review of Systems  Constitutional: Negative for fever.  Eyes: Negative for visual changes. ENT: Negative for sore throat. Cardiovascular: Negative for chest pain. Respiratory: Negative for shortness of breath. Gastrointestinal: Positive as per HPI for abdominal pain, vomiting and diarrhea. Genitourinary: Negative for dysuria. Musculoskeletal: Negative for back pain. Skin: Negative for rash. Neurological: Negative for headache.  ____________________________________________   PHYSICAL EXAM:  VITAL SIGNS: ED Triage Vitals  [01/03/18 0958]  Enc Vitals Group     BP (!) 142/79     Pulse Rate 84     Resp 17     Temp 98.2 F (36.8 C)     Temp Source Oral     SpO2 97 %     Weight 175 lb (79.4 kg)     Height 5\' 11"  (1.803 m)     Head Circumference      Peak Flow      Pain Score 8     Pain Loc      Pain Edu?      Excl. in GC?      Constitutional: Alert and oriented. Well appearing and in no distress. HEENT   Head: Normocephalic and atraumatic.      Eyes: Conjunctivae are normal. Pupils equal and round.       Ears:         Nose: No congestion/rhinnorhea.   Mouth/Throat: Mucous membranes are moist.   Neck: No stridor. Cardiovascular/Chest: Normal rate, regular rhythm.  No murmurs, rubs, or gallops. Respiratory: Normal respiratory effort without tachypnea nor retractions. Breath sounds are clear and equal bilaterally. No wheezes/rales/rhonchi. Gastrointestinal: Soft. No distention, no guarding or rebound.  Moderate tenderness diffusely without focal mc Burneys point tenderness.  Genitourinary/rectal:Deferred Musculoskeletal: Nontender with normal range of motion in all extremities. No joint effusions.  No lower extremity tenderness.  No edema. Neurologic:  Normal speech and language. No gross or focal neurologic deficits are appreciated. Skin:  Skin is warm, dry and intact. No rash noted. Psychiatric: Mood and affect are normal. Speech and behavior are normal. Patient exhibits appropriate insight and judgment.   ____________________________________________  LABS (pertinent positives/negatives) I, Governor Rooks, MD the attending physician have reviewed the labs noted below.  Labs Reviewed  URINALYSIS, COMPLETE (UACMP) WITH MICROSCOPIC - Abnormal; Notable for the following components:      Result Value   Color, Urine STRAW (*)    APPearance CLEAR (*)    All other components within normal limits  LIPASE, BLOOD  COMPREHENSIVE METABOLIC PANEL  CBC      ____________________________________________  RADIOLOGY All Xrays were viewed by me.  Imaging interpreted by Radiologist, and I, Governor Rooks, MD the attending physician have reviewed the radiologist interpretation noted below.  None __________________________________________  PROCEDURES  Procedure(s) performed: None  Critical Care performed: None   ____________________________________________  ED COURSE / ASSESSMENT AND PLAN  Pertinent labs & imaging results that were available during my care of the patient were reviewed by me and considered in my medical decision making (see chart for details).    Patient is overall well-appearing, but still reporting abdominal cramping diffusely with watery diarrhea.  Vital signs, clinical exam, laboratory evaluation all overall reassuring.  No clinical dehydration.  Not suspicious for a surgical or medical intra-abdominal emergency.  I do think at this point he should probably be referred to GI specialist and we discussed possibility of endoscopy and colonoscopy.  I will start him on prescription for Zofran as well as sucralfate for upper pain associate with eating, but he is also having the watery diarrhea.  Sound  like he does have a family history for Crohn's.  I will send him with a collection cup in case he has a stool he can turn that into his primary care doctor's office lab.  DIFFERENTIAL DIAGNOSIS: Differential diagnosis includes, but is not limited to, acute appendicitis, renal colic, testicular torsion, urinary tract infection/pyelonephritis, prostatitis,  epididymitis, diverticulitis, small bowel obstruction or ileus, colitis, abdominal aortic aneurysm, gastroenteritis, hernia, etc.  Differential diagnosis includes, but is not limited to, biliary disease (biliary colic, acute cholecystitis, cholangitis, choledocholithiasis, etc), intrathoracic causes for epigastric abdominal pain including ACS, gastritis, duodenitis,  pancreatitis, small bowel or large bowel obstruction, abdominal aortic aneurysm, hernia, and gastritis.  CONSULTATIONS: None  Patient / Family / Caregiver informed of clinical course, medical decision-making process, and agree with plan.   I discussed return precautions, follow-up instructions, and discharge instructions with patient and/or family.  Discharge Instructions : You are evaluated for nausea vomiting and watery diarrhea, and although no certain cause was found, your exam and evaluation are overall reassuring in the emerge department today.  If you have a bowel movement, you may collect it and taken to primary care doctor's office for lab analysis.  I am referring you and you should call for a gastroenterology follow-up given ongoing GI symptoms.  Return to emerge department immediately for any fever, black or bloody stools, worsening or uncontrolled pain, vomiting blood, dizziness or passing out, or any other symptoms concerning to you.    ___________________________________________   FINAL CLINICAL IMPRESSION(S) / ED DIAGNOSES   Final diagnoses:  Non-intractable vomiting, presence of nausea not specified, unspecified vomiting type  Nausea vomiting and diarrhea      ___________________________________________        Note: This dictation was prepared with Dragon dictation. Any transcriptional errors that result from this process are unintentional    Governor RooksLord, Alston Berrie, MD 01/03/18 1134

## 2018-01-10 ENCOUNTER — Other Ambulatory Visit: Payer: Self-pay

## 2018-01-10 ENCOUNTER — Encounter: Payer: Self-pay | Admitting: Gastroenterology

## 2018-01-10 ENCOUNTER — Ambulatory Visit: Payer: Medicaid Other | Admitting: Gastroenterology

## 2018-01-10 ENCOUNTER — Encounter (INDEPENDENT_AMBULATORY_CARE_PROVIDER_SITE_OTHER): Payer: Self-pay

## 2018-01-10 VITALS — BP 138/77 | HR 71 | Temp 97.6°F | Wt 182.4 lb

## 2018-01-10 DIAGNOSIS — K529 Noninfective gastroenteritis and colitis, unspecified: Secondary | ICD-10-CM

## 2018-01-10 DIAGNOSIS — R197 Diarrhea, unspecified: Secondary | ICD-10-CM | POA: Diagnosis not present

## 2018-01-10 MED ORDER — DICYCLOMINE HCL 10 MG PO CAPS: 10.0000 mg | ORAL_CAPSULE | Freq: Three times a day (TID) | ORAL | 0 refills | Status: DC

## 2018-01-10 NOTE — Progress Notes (Signed)
Arlyss Repress, MD [address]  [address]  [place], [place] [address]  Main: [phone]  Fax: [phone]    Gastroenterology Consultation  Referring Provider:     Anola Gurney, [place] Primary Care Physician:  Anola Gurney, PA Primary Gastroenterologist:  Dr. Arlyss Repress Reason for Consultation:     Abdominal pain, intermittent nausea, vomiting        HPI:   Michael [name] is a 22 y.o. male referred by Dr. Anola Gurney, PA  for consultation & management of abdominal pain, intermittent nausea, vomiting. He reports that his symptoms started about one and half months ago. Describes the abdominal pain, primarily located in lower abdomen, sharp and sometimes crampy, bloating associated with 4-5 nonbloody bowel movements daily, and urgency. He reports the cramps getting better after the end of bowel movement. He does report having intermittent episodes of nausea and nonbloody emesis. His mother is recently diagnosed with Crohn's disease and he wants to make sure that he does not have Crohn's disease. He denies weight loss, fever, chills, recent travelor recent viral illness or food poisoning. He denies IV drug abuse or smoking or alcohol. He is currently working He denies being sexually active. He is tested for HIV in the past negative in 2017. He was in the ER 3 times for these symptoms since 11/2017. He had normal CBC, CMP, lipase. Urine drug screen was negative. Alcohol levels were undetectable. He has CT A/P without contrast which did not reveal any acute intra-abdominal pathology. He is currently on Zofran and sucralfate prescribed by ER.   NSAIDs: none  Antiplts/Anticoagulants/Anti thrombotics: none  GI Procedures: none Denies family history of GI malignancy He did not have surgeries in the past  Past Medical History:  Diagnosis Date  . ADHD (attention deficit hyperactivity disorder)   . Allergy   . Chronic headache   . GERD (gastroesophageal reflux disease)   .  PTSD (post-traumatic stress disorder)     Past Surgical History:  Procedure Laterality Date  . TONSILLECTOMY AND ADENOIDECTOMY      Prior to Admission medications   Medication Sig Start Date End Date Taking? Authorizing Provider  ondansetron (ZOFRAN) 4 MG tablet Take 1 tablet (4 mg total) by mouth every 8 (eight) hours as needed for nausea or vomiting. 01/03/18  Yes Governor Rooks, MD  sucralfate (CARAFATE) 1 g tablet Take 1 tablet (1 g total) by mouth 4 (four) times daily. 01/03/18 01/03/19 Yes Governor Rooks, MD    Family History  Problem Relation Age of Onset  . Depression Mother   . Hyperlipidemia Mother   . Arthritis Mother   . Mitral valve prolapse Mother   . Alcohol abuse Father   . Epilepsy Brother   . AAA (abdominal aortic aneurysm) Brother      Social History   Tobacco Use  . Smoking status: Current Every Day Smoker    Types: Cigars  . Smokeless tobacco: Never Used  Substance Use Topics  . Alcohol use: No    Alcohol/week: 0.0 oz  . Drug use: Yes    Types: Marijuana    Allergies as of 01/10/2018 - Review Complete 01/03/2018  Allergen Reaction Noted  . Penicillins Other (See Comments) 05/09/2015  . Tramadol hcl  05/09/2015    Review of Systems:    All systems reviewed and negative except where noted in HPI.   Physical Exam:  BP 138/77   Pulse 71   Temp 97.6 F (36.4 C) (Oral)   Wt  182 lb 6.4 oz (82.7 kg)   BMI 25.44 kg/m  No LMP for male patient.  General:   Alert,  Well-developed, well-nourished, pleasant and cooperative in NAD Head:  Normocephalic and atraumatic. Eyes:  Sclera clear, no icterus.   Conjunctiva pink. Ears:  Normal auditory acuity. Nose:  No deformity, discharge, or lesions. Mouth:  No deformity or lesions,oropharynx pink & moist. Neck:  Supple; no masses or thyromegaly. Lungs:  Respirations even and unlabored.  Clear throughout to auscultation.   No wheezes, crackles, or rhonchi. No acute distress. Heart:  Regular rate and rhythm;  no murmurs, clicks, rubs, or gallops. Abdomen:  Normal bowel sounds. Soft, non-tender and non-distended without masses, hepatosplenomegaly or hernias noted.  No guarding or rebound tenderness.   Rectal: Not performed Msk:  Symmetrical without gross deformities. Good, equal movement & strength bilaterally. Pulses:  Normal pulses noted. Extremities:  No clubbing or edema.  No cyanosis. Neurologic:  Alert and oriented x3;  grossly normal neurologically. Skin:  Intact without significant lesions or rashes. No jaundice. Lymph Nodes:  No significant cervical adenopathy. Psych:  Alert and cooperative. Normal mood and affect.  Imaging Studies: reviewed  Assessment and Plan:   [name] is a 22 y.o. African-American male with no past medical history, presents with one and half months history of lower abdominal pain and nonbloody diarrhea with intermittent episodes of nausea, vomiting. Family history of Crohn's disease in first-degree relative.  Recommend infectious workup, stool studies to rule out C. Difficile and other GI pathogens Check fecal calprotectin Start dicyclomine 10 mg every 8 hours and at bedtime If above workup negative and symptoms persist, we will perform colonoscopy with biopsies  Follow up in 3 weeks   Arlyss Repressohini R Graison Leinberger, MD

## 2018-02-01 ENCOUNTER — Ambulatory Visit: Payer: Medicaid Other | Admitting: Gastroenterology

## 2018-02-01 ENCOUNTER — Encounter: Payer: Self-pay | Admitting: Gastroenterology

## 2018-04-04 ENCOUNTER — Encounter: Payer: Self-pay | Admitting: Family Medicine

## 2018-10-16 ENCOUNTER — Emergency Department
Admission: EM | Admit: 2018-10-16 | Discharge: 2018-10-16 | Disposition: A | Discharge status: Home / Self Care | Payer: Medicaid Other | Attending: Emergency Medicine | Admitting: Emergency Medicine

## 2018-10-16 ENCOUNTER — Encounter: Payer: Self-pay | Admitting: Emergency Medicine

## 2018-10-16 DIAGNOSIS — R070 Pain in throat: Secondary | ICD-10-CM | POA: Insufficient documentation

## 2018-10-16 DIAGNOSIS — F909 Attention-deficit hyperactivity disorder, unspecified type: Secondary | ICD-10-CM | POA: Insufficient documentation

## 2018-10-16 DIAGNOSIS — F17290 Nicotine dependence, other tobacco product, uncomplicated: Secondary | ICD-10-CM | POA: Insufficient documentation

## 2018-10-16 DIAGNOSIS — J069 Acute upper respiratory infection, unspecified: Secondary | ICD-10-CM

## 2018-10-16 DIAGNOSIS — Z79899 Other long term (current) drug therapy: Secondary | ICD-10-CM | POA: Insufficient documentation

## 2018-10-16 MED ORDER — PSEUDOEPH-BROMPHEN-DM 30-2-10 MG/5ML PO SYRP: 5.0000 mL | ORAL_SOLUTION | Freq: Four times a day (QID) | ORAL | 0 refills | Status: DC | PRN

## 2018-10-16 NOTE — ED Triage Notes (Signed)
Cough, chills, fatigue x 4 days.  AAOx3.  Skin warm and dry. NAD

## 2018-10-16 NOTE — ED Provider Notes (Signed)
[facility] EMERGENCY DEPARTMENT Provider Note   CSN: [id number] Arrival date & time: 10/16/18  1753     History   Chief Complaint Chief Complaint  Patient presents with  . URI    HPI Michael [name] is a 22 y.o. male.  Presents to the emergency department for evaluation of 4 days of cough, congestion, sore throat, runny nose headache and body aches.  He denies any fevers, abdominal pain, chest pain, shortness of breath, vomiting or diarrhea.  He has not been taking any medications for symptoms.  His cough is dry nonproductive.  HPI  Past Medical History:  Diagnosis Date  . ADHD (attention deficit hyperactivity disorder)   . Allergy   . Chronic headache   . GERD (gastroesophageal reflux disease)   . PTSD (post-traumatic stress disorder)     Patient Active Problem List   Diagnosis Date Noted  . 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 Suicidal ideation 09/19/2017  . Cannabis abuse 09/19/2017  . Hx of tympanostomy tubes 09/17/2015  . History of ADHD 05/09/2015  . Acne 05/09/2015  . Esophageal reflux 05/09/2015  . Personal history of traumatic brain injury 05/09/2015  . 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 Posttraumatic stress disorder 05/09/2015  . Chromophytosis 05/09/2015    Past Surgical History:  Procedure Laterality Date  . TONSILLECTOMY AND ADENOIDECTOMY          Home Medications    Prior to Admission medications   Medication Sig Start Date End Date Taking? Authorizing Provider  brompheniramine-pseudoephedrine-DM 30-2-10 MG/5ML syrup Take 5 mLs by mouth 4 (four) times daily as needed. 10/16/18   Evon SlackGaines, Thomas C, PA-C  dicyclomine (BENTYL) 10 MG capsule Take 1 capsule (10 mg total) by mouth 4 (four) times daily -  before meals and at bedtime. 01/10/18 02/09/18  Toney ReilVanga, Rohini Reddy, MD  ondansetron (ZOFRAN) 4 MG tablet Take 1 tablet (4 mg total) by mouth every 8 (eight) hours  as needed for nausea or vomiting. 01/03/18   Governor RooksLord, Rebecca, MD  sucralfate (CARAFATE) 1 g tablet Take 1 tablet (1 g total) by mouth 4 (four) times daily. 01/03/18 01/03/19  Governor RooksLord, Rebecca, MD    Family History Family History  Problem Relation Age of Onset  . Depression Mother   . Hyperlipidemia Mother   . Arthritis Mother   . Mitral valve prolapse Mother   . Alcohol abuse Father   . Epilepsy Brother   . AAA (abdominal aortic aneurysm) Brother     Social History Social History   Tobacco Use  . Smoking status: Current Every Day Smoker    Types: Cigars  . Smokeless tobacco: Never Used  Substance Use Topics  . Alcohol use: No    Alcohol/week: 0.0 standard drinks  . Drug use: Yes    Types: Marijuana     Allergies   Penicillins and Tramadol hcl   Review of Systems Review of Systems  Constitutional: Negative for fever.  HENT: Positive for congestion, rhinorrhea and sore throat. Negative for sinus pressure and sinus pain.   Respiratory: Positive for cough. Negative for shortness of breath, wheezing and stridor.   Gastrointestinal: Negative for diarrhea, nausea and vomiting.  Musculoskeletal: Positive for myalgias. Negative for arthralgias and neck stiffness.  Skin: Negative for rash.  Neurological: Negative for dizziness.     Physical Exam Updated Vital Signs BP (!) 112/58 (BP Location: Left Arm)   Pulse (!) 56   Temp 98.3 F (36.8 C) (  Oral)   Wt 82.7 kg   SpO2 98%   BMI 25.43 kg/m   Physical Exam  Constitutional: He is oriented to person, place, and time. He appears well-developed and well-nourished. No distress.  HENT:  Head: Normocephalic and atraumatic.  Right Ear: Hearing, tympanic membrane, external ear and ear canal normal.  Left Ear: Hearing, tympanic membrane, external ear and ear canal normal.  Nose: Rhinorrhea present. No nasal septal hematoma. Right sinus exhibits no maxillary sinus tenderness and no frontal sinus tenderness. Left sinus exhibits no  maxillary sinus tenderness and no frontal sinus tenderness.  Mouth/Throat: Oropharynx is clear and moist. No trismus in the jaw. No uvula swelling. No oropharyngeal exudate, posterior oropharyngeal edema, posterior oropharyngeal erythema or tonsillar abscesses.  Eyes: Conjunctivae are normal.  Neck: Normal range of motion. Neck supple.  Cardiovascular: Normal rate, regular rhythm and normal heart sounds.  Pulmonary/Chest: Effort normal and breath sounds normal. No stridor. No respiratory distress. He has no wheezes. He exhibits no tenderness.  Abdominal: Soft. He exhibits no distension. There is no tenderness. There is no guarding.  Musculoskeletal: Normal range of motion.  Lymphadenopathy:    He has cervical adenopathy (.  Posterior cervical lymphadenopathy).  Neurological: He is alert and oriented to person, place, and time.  Skin: Skin is warm and dry. No rash noted.  Psychiatric: He has a normal mood and affect. His behavior is normal. Judgment and thought content normal.     ED Treatments / Results  Labs (all labs ordered are listed, but only abnormal results are displayed) Labs Reviewed - No data to display  EKG None  Radiology No results found.  Procedures Procedures (including critical care time)  Medications Ordered in ED Medications - No data to display   Initial Impression / Assessment and Plan / ED Course  I have reviewed the triage vital signs and the nursing notes.  Pertinent labs & imaging results that were available during my care of the patient were reviewed by me and considered in my medical decision making (see chart for details).     22 year old male with upper respiratory infection.  Vital signs are stable, afebrile no signs of dehydration.  He is given a prescription for Bromfed and advised to alternate Tylenol and ibuprofen.  He will make sure she can lots of fluids.  He understands signs symptoms return to ED for.  Final Clinical Impressions(s) / ED  Diagnoses   Final diagnoses:  Viral upper respiratory tract infection    ED Discharge Orders         Ordered    brompheniramine-pseudoephedrine-DM 30-2-10 MG/5ML syrup  4 times daily PRN     10/16/18 1913           Ronnette Juniper 10/16/18 1916    Myrna Blazer, MD 10/16/18 [phone]

## 2018-10-16 NOTE — Discharge Instructions (Addendum)
Please take Bromfed cough syrup as prescribed.  Alternate Tylenol and ibuprofen as needed for body aches and or fevers.  If any fevers above 101 return to the emergency department.  Make sure you are drinking lots of fluids.

## 2018-10-16 NOTE — ED Notes (Signed)
See triage note  Presents with cough  Cold sx's and fatigue  States sx's started 4 days ago  No fever  Also has had some sore throat

## 2018-10-25 ENCOUNTER — Other Ambulatory Visit: Payer: Self-pay

## 2018-10-25 ENCOUNTER — Emergency Department
Admission: EM | Admit: 2018-10-25 | Discharge: 2018-10-25 | Disposition: A | Discharge status: Home / Self Care | Payer: Medicaid Other | Attending: Emergency Medicine | Admitting: Emergency Medicine

## 2018-10-25 ENCOUNTER — Encounter: Payer: Self-pay | Admitting: Emergency Medicine

## 2018-10-25 DIAGNOSIS — J111 Influenza due to unidentified influenza virus with other respiratory manifestations: Secondary | ICD-10-CM

## 2018-10-25 DIAGNOSIS — R5383 Other fatigue: Secondary | ICD-10-CM | POA: Insufficient documentation

## 2018-10-25 DIAGNOSIS — Z79899 Other long term (current) drug therapy: Secondary | ICD-10-CM | POA: Insufficient documentation

## 2018-10-25 DIAGNOSIS — R0602 Shortness of breath: Secondary | ICD-10-CM | POA: Insufficient documentation

## 2018-10-25 DIAGNOSIS — R112 Nausea with vomiting, unspecified: Secondary | ICD-10-CM | POA: Insufficient documentation

## 2018-10-25 DIAGNOSIS — E860 Dehydration: Secondary | ICD-10-CM

## 2018-10-25 DIAGNOSIS — F17290 Nicotine dependence, other tobacco product, uncomplicated: Secondary | ICD-10-CM | POA: Insufficient documentation

## 2018-10-25 DIAGNOSIS — M545 Low back pain: Secondary | ICD-10-CM | POA: Insufficient documentation

## 2018-10-25 DIAGNOSIS — F909 Attention-deficit hyperactivity disorder, unspecified type: Secondary | ICD-10-CM | POA: Insufficient documentation

## 2018-10-25 DIAGNOSIS — M7918 Myalgia, other site: Secondary | ICD-10-CM | POA: Insufficient documentation

## 2018-10-25 DIAGNOSIS — F1210 Cannabis abuse, uncomplicated: Secondary | ICD-10-CM | POA: Insufficient documentation

## 2018-10-25 MED ORDER — ACETAMINOPHEN 500 MG PO TABS
1000.0000 mg | ORAL_TABLET | Freq: Once | ORAL | Status: AC
  Administered 2018-10-25: 1000 mg via ORAL
  Filled 2018-10-25: qty 2

## 2018-10-25 MED ORDER — SODIUM CHLORIDE 0.9 % IV BOLUS
1000.0000 mL | Freq: Once | INTRAVENOUS | Status: AC
  Administered 2018-10-25: 1000 mL via INTRAVENOUS

## 2018-10-25 MED ORDER — KETOROLAC TROMETHAMINE 30 MG/ML IJ SOLN
15.0000 mg | Freq: Once | INTRAMUSCULAR | Status: AC
  Administered 2018-10-25: 15 mg via INTRAVENOUS
  Filled 2018-10-25: qty 1

## 2018-10-25 MED ORDER — OSELTAMIVIR PHOSPHATE 75 MG PO CAPS
75.0000 mg | ORAL_CAPSULE | Freq: Once | ORAL | Status: AC
  Administered 2018-10-25: 75 mg via ORAL
  Filled 2018-10-25: qty 1

## 2018-10-25 MED ORDER — IBUPROFEN 600 MG PO TABS: 600.0000 mg | ORAL_TABLET | Freq: Three times a day (TID) | ORAL | 0 refills | Status: DC | PRN

## 2018-10-25 MED ORDER — ONDANSETRON HCL 4 MG/2ML IJ SOLN
4.0000 mg | Freq: Once | INTRAMUSCULAR | Status: AC
  Administered 2018-10-25: 4 mg via INTRAVENOUS
  Filled 2018-10-25: qty 2

## 2018-10-25 MED ORDER — OSELTAMIVIR PHOSPHATE 75 MG PO CAPS: 75.0000 mg | ORAL_CAPSULE | Freq: Two times a day (BID) | ORAL | 0 refills | Status: AC

## 2018-10-25 MED ORDER — ONDANSETRON 4 MG PO TBDP: 4.0000 mg | ORAL_TABLET | Freq: Three times a day (TID) | ORAL | 0 refills | Status: DC | PRN

## 2018-10-25 NOTE — ED Provider Notes (Signed)
[facility] Emergency Department Provider Note  ____________________________________________   First MD Initiated Contact with Patient 10/25/18 0122     (approximate)  I have reviewed the triage vital signs and the nursing notes.   HISTORY  Chief Complaint Emesis   HPI Michael [name] is a 22 y.o. male self presents to the emergency department with roughly 24 hours of cough, shortness of breath, myalgias, fatigue, nausea, vomiting, and low back pain.  He does have a family member at home who is diagnosed with influenza via nasal swab earlier this week.  The patient did not get a flu shot this year.  His symptoms came on gradually are now moderate to severe and nothing seems to make them better or worse.  He is taken no medication for his symptoms and nothing seems to help.  His primary concern is low back pain and myalgias.  He said that he feels "cold" and is difficult to warm up.  He has copious rhinorrhea.    Past Medical History:  Diagnosis Date  . ADHD (attention deficit hyperactivity disorder)   . Allergy   . Chronic headache   . GERD (gastroesophageal reflux disease)   . PTSD (post-traumatic stress disorder)     Patient Active Problem List   Diagnosis Date Noted  . 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 Suicidal ideation 09/19/2017  . Cannabis abuse 09/19/2017  . Hx of tympanostomy tubes 09/17/2015  . History of ADHD 05/09/2015  . Acne 05/09/2015  . Esophageal reflux 05/09/2015  . Personal history of traumatic brain injury 05/09/2015  . 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 Posttraumatic stress disorder 05/09/2015  . Chromophytosis 05/09/2015    Past Surgical History:  Procedure Laterality Date  . TONSILLECTOMY AND ADENOIDECTOMY      Prior to Admission medications   Medication Sig Start Date End Date Taking? Authorizing Provider  brompheniramine-pseudoephedrine-DM 30-2-10  MG/5ML syrup Take 5 mLs by mouth 4 (four) times daily as needed. 10/16/18   Evon SlackGaines, Thomas C, PA-C  dicyclomine (BENTYL) 10 MG capsule Take 1 capsule (10 mg total) by mouth 4 (four) times daily -  before meals and at bedtime. 01/10/18 02/09/18  Toney ReilVanga, Rohini Reddy, MD  ibuprofen (ADVIL,MOTRIN) 600 MG tablet Take 1 tablet (600 mg total) by mouth every 8 (eight) hours as needed. 10/25/18   Merrily Brittleifenbark, Maximum Reiland, MD  ondansetron (ZOFRAN ODT) 4 MG disintegrating tablet Take 1 tablet (4 mg total) by mouth every 8 (eight) hours as needed for nausea or vomiting. 10/25/18   Merrily Brittleifenbark, Ichelle Harral, MD  ondansetron (ZOFRAN) 4 MG tablet Take 1 tablet (4 mg total) by mouth every 8 (eight) hours as needed for nausea or vomiting. 01/03/18   Governor RooksLord, Rebecca, MD  oseltamivir (TAMIFLU) 75 MG capsule Take 1 capsule (75 mg total) by mouth 2 (two) times daily for 5 days. 10/25/18 10/30/18  Merrily Brittleifenbark, Laban Orourke, MD  sucralfate (CARAFATE) 1 g tablet Take 1 tablet (1 g total) by mouth 4 (four) times daily. 01/03/18 01/03/19  Governor RooksLord, Rebecca, MD    Allergies Penicillins and Tramadol hcl  Family History  Problem Relation Age of Onset  . Depression Mother   . Hyperlipidemia Mother   . Arthritis Mother   . Mitral valve prolapse Mother   . Alcohol abuse Father   . Epilepsy Brother   . AAA (abdominal aortic aneurysm) Brother     Social History Social History   Tobacco Use  . Smoking status: Current Every Day  Smoker    Types: Cigars  . Smokeless tobacco: Never Used  Substance Use Topics  . Alcohol use: No    Alcohol/week: 0.0 standard drinks  . Drug use: Yes    Types: Marijuana    Review of Systems Constitutional: Positive for fevers and chills Eyes: No visual changes. ENT: Positive for sore throat. Cardiovascular: Positive for chest pain. Respiratory: Positive for shortness of breath. Gastrointestinal: No abdominal pain.  Positive for nausea, positive for vomiting.  No diarrhea.  No constipation. Genitourinary: Negative for  dysuria. Musculoskeletal: Positive for back pain. Skin: Negative for rash. Neurological: Positive for headaches  ____________________________________________   PHYSICAL EXAM:  VITAL SIGNS: ED Triage Vitals  Enc Vitals Group     BP 10/25/18 0116 123/64     Pulse Rate 10/25/18 0116 95     Resp 10/25/18 0116 18     Temp 10/25/18 0116 98.6 F (37 C)     Temp Source 10/25/18 0116 Oral     SpO2 10/25/18 0116 98 %     Weight 10/25/18 0117 175 lb (79.4 kg)     Height 10/25/18 0117 5\' 11"  (1.803 m)     Head Circumference --      Peak Flow --      Pain Score 10/25/18 0116 10     Pain Loc --      Pain Edu? --      Excl. in GC? --     Constitutional: Alert and oriented x4 appears obviously somewhat uncomfortable although nontoxic no diaphoresis Eyes: PERRL EOMI. midrange and brisk Head: Atraumatic. Nose: Both congestion and rhinorrhea Mouth/Throat: No trismus uvula midline no pharyngeal erythema or exudate Neck: No stridor.  No meningismus Cardiovascular: Normal rate, regular rhythm. Grossly normal heart sounds.  Good peripheral circulation. Respiratory: Creased respiratory effort with faint crackles bilaterally although moving good air and no rhonchi Gastrointestinal: Soft mild diffuse tenderness with no rebound or guarding no peritonitis Musculoskeletal: No lower extremity edema   Neurologic:  Normal speech and language. No gross focal neurologic deficits are appreciated. Skin:  Skin is warm, dry and intact. No rash noted. Psychiatric: Mood and affect are normal. Speech and behavior are normal.    ____________________________________________   DIFFERENTIAL includes but not limited to  Influenza, influenza-like illness, viral syndrome, pneumonia, sinus infection, otitis media ____________________________________________   LABS (all labs ordered are listed, but only abnormal results are displayed)  Labs Reviewed - No data to  display   __________________________________________  EKG   ____________________________________________  RADIOLOGY   ____________________________________________   PROCEDURES  Procedure(s) performed: no  Procedures  Critical Care performed: no  ____________________________________________   INITIAL IMPRESSION / ASSESSMENT AND PLAN / ED COURSE  Pertinent labs & imaging results that were available during my care of the patient were reviewed by me and considered in my medical decision making (see chart for details).   As part of my medical decision making, I reviewed the following data within the electronic MEDICAL RECORD NUMBER History obtained from family if available, nursing notes, old chart and ekg, as well as notes from prior ED visits.  The patient comes to the emergency department with myalgias, rhinorrhea, congestion, dry cough, and fatigue along with nausea and vomiting for less than 24 hours.  He does have a close family member who was diagnosed recently with influenza and given his clinical condition he most likely has influenza as well.  No indication for testing.  Establish IV access and given the patient a liter of IV  fluids along with 4 mg of Zofran, 15 mg of Toradol, oral Tylenol, and a first dose of Tamiflu here.  I will prescribe 5 days of Tamiflu and discussed strict return precautions.  Prior to discharge the patient feels significantly improved.      ____________________________________________   FINAL CLINICAL IMPRESSION(S) / ED DIAGNOSES  Final diagnoses:  Influenza  Dehydration      NEW MEDICATIONS STARTED DURING THIS VISIT:  Discharge Medication List as of 10/25/2018  1:44 AM    START taking these medications   Details  ibuprofen (ADVIL,MOTRIN) 600 MG tablet Take 1 tablet (600 mg total) by mouth every 8 (eight) hours as needed., Starting Wed 10/25/2018, Print    ondansetron (ZOFRAN ODT) 4 MG disintegrating tablet Take 1 tablet (4 mg total) by  mouth every 8 (eight) hours as needed for nausea or vomiting., Starting Wed 10/25/2018, Print    oseltamivir (TAMIFLU) 75 MG capsule Take 1 capsule (75 mg total) by mouth 2 (two) times daily for 5 days., Starting Wed 10/25/2018, Until Mon 10/30/2018, Print         Note:  This document was prepared using Dragon voice recognition software and may include unintentional dictation errors.     Merrily Brittle, MD 10/28/18 [phone]

## 2018-10-25 NOTE — ED Triage Notes (Signed)
Patient ambulatory to triage with steady gait, without difficulty or distress noted; pt reports N/V today with lower back pain

## 2018-10-25 NOTE — Discharge Instructions (Signed)
With influenza it is normal to be sick for a fall 6 to 7 days.  Please take your Tamiflu as prescribed and make sure you remain well-hydrated and follow up with your PMD as needed.  Return to the ED sooner for any concerns.  It was a pleasure to take care of you today, and thank you for coming to our emergency department.  If you have any questions or concerns before leaving please ask the nurse to grab me and I'm more than happy to go through your aftercare instructions again.  If you have any concerns once you are home that you are not improving or are in fact getting worse before you can make it to your follow-up appointment, please do not hesitate to call [phone] and come back for further evaluation.  Merrily BrittleNeil Temitayo Covalt, MD

## 2019-03-18 ENCOUNTER — Encounter: Payer: Self-pay | Admitting: Emergency Medicine

## 2019-03-18 ENCOUNTER — Other Ambulatory Visit: Payer: Self-pay

## 2019-03-18 ENCOUNTER — Emergency Department
Admission: EM | Admit: 2019-03-18 | Discharge: 2019-03-18 | Disposition: A | Discharge status: Home / Self Care | Payer: Self-pay | Attending: Emergency Medicine | Admitting: Emergency Medicine

## 2019-03-18 DIAGNOSIS — X58XXXA Exposure to other specified factors, initial encounter: Secondary | ICD-10-CM | POA: Diagnosis not present

## 2019-03-18 DIAGNOSIS — S80212A Abrasion, left knee, initial encounter: Secondary | ICD-10-CM | POA: Insufficient documentation

## 2019-03-18 DIAGNOSIS — Y939 Activity, unspecified: Secondary | ICD-10-CM | POA: Diagnosis not present

## 2019-03-18 DIAGNOSIS — T07XXXA Unspecified multiple injuries, initial encounter: Secondary | ICD-10-CM

## 2019-03-18 DIAGNOSIS — Z23 Encounter for immunization: Secondary | ICD-10-CM | POA: Diagnosis not present

## 2019-03-18 DIAGNOSIS — Y999 Unspecified external cause status: Secondary | ICD-10-CM | POA: Diagnosis not present

## 2019-03-18 DIAGNOSIS — S60512A Abrasion of left hand, initial encounter: Secondary | ICD-10-CM | POA: Insufficient documentation

## 2019-03-18 DIAGNOSIS — Y92149 Unspecified place in prison as the place of occurrence of the external cause: Secondary | ICD-10-CM | POA: Diagnosis not present

## 2019-03-18 DIAGNOSIS — S6991XA Unspecified injury of right wrist, hand and finger(s), initial encounter: Secondary | ICD-10-CM | POA: Diagnosis present

## 2019-03-18 DIAGNOSIS — S61210A Laceration without foreign body of right index finger without damage to nail, initial encounter: Secondary | ICD-10-CM | POA: Diagnosis not present

## 2019-03-18 DIAGNOSIS — S50812A Abrasion of left forearm, initial encounter: Secondary | ICD-10-CM | POA: Diagnosis not present

## 2019-03-18 DIAGNOSIS — R45851 Suicidal ideations: Secondary | ICD-10-CM | POA: Insufficient documentation

## 2019-03-18 DIAGNOSIS — Z046 Encounter for general psychiatric examination, requested by authority: Secondary | ICD-10-CM | POA: Insufficient documentation

## 2019-03-18 DIAGNOSIS — F17290 Nicotine dependence, other tobacco product, uncomplicated: Secondary | ICD-10-CM | POA: Insufficient documentation

## 2019-03-18 MED ORDER — CLINDAMYCIN HCL 150 MG PO CAPS
300.0000 mg | ORAL_CAPSULE | Freq: Once | ORAL | Status: AC
  Administered 2019-03-18: 300 mg via ORAL
  Filled 2019-03-18: qty 2

## 2019-03-18 MED ORDER — TETANUS-DIPHTH-ACELL PERTUSSIS 5-2.5-18.5 LF-MCG/0.5 IM SUSP
0.5000 mL | Freq: Once | INTRAMUSCULAR | Status: AC
  Administered 2019-03-18: 14:00:00 0.5 mL via INTRAMUSCULAR
  Filled 2019-03-18: qty 0.5

## 2019-03-18 MED ORDER — CLINDAMYCIN HCL 150 MG PO CAPS: 300.0000 mg | ORAL_CAPSULE | Freq: Three times a day (TID) | ORAL | 0 refills | Status: DC

## 2019-03-18 MED ORDER — LIDOCAINE HCL (PF) 1 % IJ SOLN
5.0000 mL | Freq: Once | INTRAMUSCULAR | Status: DC
  Filled 2019-03-18: qty 5

## 2019-03-18 NOTE — ED Notes (Signed)
AAOx3.  Skin warm and dry. NAD.  Ambulates with easy and steady gait.   

## 2019-03-18 NOTE — Discharge Instructions (Signed)
Follow-up with your regular doctor or the nurses at the jail to have sutures removed in 7 to 10 days.  You need to take the antibiotic as prescribed.  Clean the wound with soap and water after 24 to 48 hours.  Keep a dressing on the area while incarcerated.

## 2019-03-18 NOTE — ED Triage Notes (Signed)
Pt presents to ED via [employer] in custody with c/o " I feel like committing suicide." Per [employer] officers pt is under arrest at this time. [employer] officers state that he needs medical clearance to be taken to jail to be put on suicide watch. Pt with laceration to R finger, abrasions to L forearm and L knee. Pt currently in forensic restraints at this time.

## 2019-03-18 NOTE — ED Notes (Signed)
Pt escorted back to room by this RN and 2 [employer] officers. This RN verified with Officer Bonilla that [employer] would not be relinquishing custody of patient, Officer Doreatha Martin verified that pt is to remain in [employer] custody during his stay at [facility], be medically cleared for physical injury, and sent back to be on suicide watch at the jail and evaluated for psych concerns at the jail. Pt no longer in forensic restraints upon arrival to room.

## 2019-03-18 NOTE — ED Provider Notes (Signed)
[facility] Emergency Department Provider Note  ____________________________________________   First MD Initiated Contact with Patient 03/18/19 1335     (approximate)  I have reviewed the triage vital signs and the nursing notes.   HISTORY  Chief Complaint Medical Clearance    HPI Michael [name] is a 23 y.o. male presents emergency department in custody of the [place] [employer].  Patient has multiple abrasions and a laceration to the right index finger.  Most likely the lacerations happened around 10 or 11 last night.  Patient is unsure of his last tetanus.  He is here to be evaluated for physical injury and then will be discharged in police custody.  They are not relinquishing custody of the patient while here in the ED.    Past Medical History:  Diagnosis Date  . ADHD (attention deficit hyperactivity disorder)   . Allergy   . Chronic headache   . GERD (gastroesophageal reflux disease)   . PTSD (post-traumatic stress disorder)     Patient Active Problem List   Diagnosis Date Noted  . 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 Suicidal ideation 09/19/2017  . Cannabis abuse 09/19/2017  . Hx of tympanostomy tubes 09/17/2015  . History of ADHD 05/09/2015  . Acne 05/09/2015  . Esophageal reflux 05/09/2015  . Personal history of traumatic brain injury 05/09/2015  . 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 Posttraumatic stress disorder 05/09/2015  . Chromophytosis 05/09/2015    Past Surgical History:  Procedure Laterality Date  . TONSILLECTOMY AND ADENOIDECTOMY      Prior to Admission medications   Medication Sig Start Date End Date Taking? Authorizing Provider  brompheniramine-pseudoephedrine-DM 30-2-10 MG/5ML syrup Take 5 mLs by mouth 4 (four) times daily as needed. 10/16/18   Evon SlackGaines, Thomas C, PA-C  clindamycin (CLEOCIN) 150 MG capsule Take 2 capsules (300 mg total) by mouth 3 (three) times  daily. 03/18/19   Fisher, Roselyn BeringSusan W, PA-C  dicyclomine (BENTYL) 10 MG capsule Take 1 capsule (10 mg total) by mouth 4 (four) times daily -  before meals and at bedtime. 01/10/18 02/09/18  Toney ReilVanga, Rohini Reddy, MD  ibuprofen (ADVIL,MOTRIN) 600 MG tablet Take 1 tablet (600 mg total) by mouth every 8 (eight) hours as needed. 10/25/18   Merrily Brittleifenbark, Neil, MD  ondansetron (ZOFRAN ODT) 4 MG disintegrating tablet Take 1 tablet (4 mg total) by mouth every 8 (eight) hours as needed for nausea or vomiting. 10/25/18   Merrily Brittleifenbark, Neil, MD  ondansetron (ZOFRAN) 4 MG tablet Take 1 tablet (4 mg total) by mouth every 8 (eight) hours as needed for nausea or vomiting. 01/03/18   Governor RooksLord, Rebecca, MD  sucralfate (CARAFATE) 1 g tablet Take 1 tablet (1 g total) by mouth 4 (four) times daily. 01/03/18 01/03/19  Governor RooksLord, Rebecca, MD    Allergies Penicillins and Tramadol hcl  Family History  Problem Relation Age of Onset  . Depression Mother   . Hyperlipidemia Mother   . Arthritis Mother   . Mitral valve prolapse Mother   . Alcohol abuse Father   . Epilepsy Brother   . AAA (abdominal aortic aneurysm) Brother     Social History Social History   Tobacco Use  . Smoking status: Current Every Day Smoker    Types: Cigars  . Smokeless tobacco: Never Used  Substance Use Topics  . Alcohol use: No    Alcohol/week: 0.0 standard drinks  . Drug use: Yes    Types: Marijuana    Review  of Systems  Constitutional: No fever/chills Eyes: No visual changes. ENT: No sore throat. Respiratory: Denies cough Genitourinary: Negative for dysuria. Musculoskeletal: Negative for back pain.  Positive for multiple abrasions and a laceration to the right index finger Skin: Negative for rash.    ____________________________________________   PHYSICAL EXAM:  VITAL SIGNS: ED Triage Vitals  Enc Vitals Group     BP --      Pulse --      Resp --      Temp --      Temp src --      SpO2 --      Weight 03/18/19 1323 170 lb (77.1 kg)      Height 03/18/19 1323  (1.803 m)     Head Circumference --      Peak Flow --      Pain Score 03/18/19 1322 9     Pain Loc --      Pain Edu? --      Excl. in GC? --     Constitutional: Alert and oriented. Well appearing and in no acute distress. Eyes: Conjunctivae are normal.  Head: Atraumatic. Nose: No congestion/rhinnorhea. Mouth/Throat: Mucous membranes are moist.   Neck:  supple no lymphadenopathy noted Cardiovascular: Normal rate, regular rhythm. Heart sounds are normal Respiratory: Normal respiratory effort.  No retractions, lungs c t a  GU: deferred Musculoskeletal: FROM all extremities, warm and well perfused, positive for 1 cm laceration to the right index finger on the volar surface.  Wound appears to be slightly old, no foreign body is noted Neurologic:  Normal speech and language.  Skin:  Skin is warm, dry , positive lacerations and abrasions. No rash noted. Psychiatric: Mood and affect are normal. Speech and behavior are normal.  ____________________________________________   LABS (all labs ordered are listed, but only abnormal results are displayed)  Labs Reviewed - No data to display ____________________________________________   ____________________________________________  RADIOLOGY    ____________________________________________   PROCEDURES  Procedure(s) performed:   Marland KitchenMarland KitchenLaceration Repair Date/Time: 03/18/2019 2:24 PM Performed by: Faythe Ghee, PA-C Authorized by: Faythe Ghee, PA-C   Consent:    Consent obtained:  Verbal   Consent given by:  Patient   Risks discussed:  Infection, pain, poor cosmetic result and poor wound healing   Alternatives discussed:  No treatment Anesthesia (see MAR for exact dosages):    Anesthesia method:  Nerve block   Block anesthetic:  Lidocaine 1% w/o epi   Block injection procedure:  Anatomic landmarks identified, introduced needle, incremental injection, anatomic landmarks palpated and negative  aspiration for blood   Block outcome:  Anesthesia achieved Laceration details:    Location:  Finger   Finger location:  R index finger   Length (cm):  1.5   Depth (mm):  3 Repair type:    Repair type:  Simple Pre-procedure details:    Preparation:  Patient was prepped and draped in usual sterile fashion Exploration:    Hemostasis achieved with:  Direct pressure   Wound exploration: wound explored through full range of motion     Wound extent: no foreign bodies/material noted, no nerve damage noted, no tendon damage noted and no underlying fracture noted     Contaminated: no   Treatment:    Area cleansed with:  Betadine and saline   Amount of cleaning:  Standard   Irrigation solution:  Sterile saline   Irrigation method:  Syringe and tap Skin repair:    Repair method:  Sutures  Suture size:  5-0   Suture material:  Nylon   Suture technique:  Simple interrupted   Number of sutures:  3 Approximation:    Approximation:  Close Post-procedure details:    Dressing:  Non-adherent dressing   Patient tolerance of procedure:  Tolerated well, no immediate complications      ____________________________________________   INITIAL IMPRESSION / ASSESSMENT AND PLAN / ED COURSE  Pertinent labs & imaging results that were available during my care of the patient were reviewed by me and considered in my medical decision making (see chart for details).   Patient is a 23 year old male presents emergency department in the custody of [place] police officers.  He is here to be physically cleared and will be on suicide watch at the jail.  Unsure of last Tdap  Physical exam shows multiple abrasions and a laceration to the right index finger.  No foreign body is noted.  No tendon involvement is noted.  See procedure note for repair.  Patient was given a Tdap while here in the ED.  Dressing was applied by nursing staff.  He was given a prescription for clindamycin as the wound has been open  for more than 12 hours.  [place] PD states the prescription will be given to the nursing staff at the jail.  Patient was discharged in stable condition in the custody of the [facility].     As part of my medical decision making, I reviewed the following data within the electronic MEDICAL RECORD NUMBER Nursing notes reviewed and incorporated, Old chart reviewed, Notes from prior ED visits and [place] Controlled Substance Database  ____________________________________________   FINAL CLINICAL IMPRESSION(S) / ED DIAGNOSES  Final diagnoses:  Laceration of right index finger without foreign body without damage to nail, initial encounter  Multiple abrasions      NEW MEDICATIONS STARTED DURING THIS VISIT:  Discharge Medication List as of 03/18/2019  2:23 PM    START taking these medications   Details  clindamycin (CLEOCIN) 150 MG capsule Take 2 capsules (300 mg total) by mouth 3 (three) times daily., Starting Sun 03/18/2019, Print         Note:  This document was prepared using Dragon voice recognition software and may include unintentional dictation errors.    Faythe Ghee, PA-C 03/18/19 1427    Nita Sickle, MD 03/18/19 [phone]

## 2019-03-18 NOTE — ED Notes (Signed)
Patient discharged in Police custody with [employer].
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
## 2019-06-05 ENCOUNTER — Inpatient Hospital Stay
Admission: EM | Admit: 2019-06-05 | Discharge: 2019-06-06 | DRG: 694 | Disposition: A | Discharge status: Home / Self Care | Payer: Self-pay | Attending: Internal Medicine | Admitting: Internal Medicine

## 2019-06-05 ENCOUNTER — Other Ambulatory Visit: Payer: Self-pay

## 2019-06-05 DIAGNOSIS — N132 Hydronephrosis with renal and ureteral calculous obstruction: Principal | ICD-10-CM | POA: Diagnosis present

## 2019-06-05 DIAGNOSIS — K219 Gastro-esophageal reflux disease without esophagitis: Secondary | ICD-10-CM | POA: Diagnosis present

## 2019-06-05 DIAGNOSIS — Z87442 Personal history of urinary calculi: Secondary | ICD-10-CM

## 2019-06-05 DIAGNOSIS — E876 Hypokalemia: Secondary | ICD-10-CM | POA: Diagnosis present

## 2019-06-05 DIAGNOSIS — Z880 Allergy status to penicillin: Secondary | ICD-10-CM

## 2019-06-05 DIAGNOSIS — R52 Pain, unspecified: Secondary | ICD-10-CM

## 2019-06-05 DIAGNOSIS — F17290 Nicotine dependence, other tobacco product, uncomplicated: Secondary | ICD-10-CM | POA: Diagnosis present

## 2019-06-05 DIAGNOSIS — K5900 Constipation, unspecified: Secondary | ICD-10-CM | POA: Diagnosis present

## 2019-06-05 DIAGNOSIS — Z820 Family history of epilepsy and other diseases of the nervous system: Secondary | ICD-10-CM

## 2019-06-05 DIAGNOSIS — Z1159 Encounter for screening for other viral diseases: Secondary | ICD-10-CM

## 2019-06-05 DIAGNOSIS — N200 Calculus of kidney: Secondary | ICD-10-CM

## 2019-06-05 DIAGNOSIS — R030 Elevated blood-pressure reading, without diagnosis of hypertension: Secondary | ICD-10-CM | POA: Diagnosis present

## 2019-06-05 DIAGNOSIS — Z885 Allergy status to narcotic agent status: Secondary | ICD-10-CM

## 2019-06-05 DIAGNOSIS — Z8349 Family history of other endocrine, nutritional and metabolic diseases: Secondary | ICD-10-CM

## 2019-06-05 HISTORY — DX: Personal history of urinary calculi: Z87.442

## 2019-06-05 NOTE — ED Triage Notes (Signed)
Pt to ed via ems from home. Pt c/o right side flank and abd pain, pt states "it hurts all the way down in my balls." pt having no issues urinating. Pt states he has had trouble with bowel movements. Pt states pain is 10/10 after receiving 159mcg fentanyl from ems, pt falling asleep during triage. VSS.

## 2019-06-06 ENCOUNTER — Encounter: Payer: Self-pay | Admitting: Emergency Medicine

## 2019-06-06 ENCOUNTER — Emergency Department: Payer: Self-pay

## 2019-06-06 ENCOUNTER — Other Ambulatory Visit: Payer: Self-pay | Admitting: Radiology

## 2019-06-06 DIAGNOSIS — N201 Calculus of ureter: Secondary | ICD-10-CM

## 2019-06-06 DIAGNOSIS — N200 Calculus of kidney: Secondary | ICD-10-CM

## 2019-06-06 LAB — COMPREHENSIVE METABOLIC PANEL
ALT: 21 U/L (ref 0–44)
AST: 25 U/L (ref 15–41)
Albumin: 4.1 g/dL (ref 3.5–5.0)
Alkaline Phosphatase: 92 U/L (ref 38–126)
Anion gap: 9 (ref 5–15)
BUN: 14 mg/dL (ref 6–20)
CO2: 28 mmol/L (ref 22–32)
Calcium: 8.7 mg/dL — ABNORMAL LOW (ref 8.9–10.3)
Chloride: 104 mmol/L (ref 98–111)
Creatinine, Ser: 0.91 mg/dL (ref 0.61–1.24)
GFR calc Af Amer: >60 mL/min (ref >60)
GFR calc non Af Amer: >60 mL/min (ref >60)
Glucose, Bld: 109 mg/dL — ABNORMAL HIGH (ref 70–99)
Potassium: 3.1 mmol/L — ABNORMAL LOW (ref 3.5–5.1)
Sodium: 141 mmol/L (ref 135–145)
Total Bilirubin: 0.9 mg/dL (ref 0.3–1.2)
Total Protein: 6.8 g/dL (ref 6.5–8.1)

## 2019-06-06 LAB — CBC
HCT: 44.1 % (ref 39.0–52.0)
Hemoglobin: 14.8 g/dL (ref 13.0–17.0)
MCH: 31.7 pg (ref 26.0–34.0)
MCHC: 33.6 g/dL (ref 30.0–36.0)
MCV: 94.4 fL (ref 80.0–100.0)
Platelets: 164 10*3/uL (ref 150–400)
RBC: 4.67 MIL/uL (ref 4.22–5.81)
RDW: 12.7 % (ref 11.5–15.5)
WBC: 9.5 10*3/uL (ref 4.0–10.5)
nRBC: 0 % (ref 0.0–0.2)

## 2019-06-06 LAB — TSH: TSH: 0.693 u[IU]/mL (ref 0.350–4.500)

## 2019-06-06 LAB — LIPASE, BLOOD: Lipase: 27 U/L (ref 11–51)

## 2019-06-06 LAB — URINALYSIS, COMPLETE (UACMP) WITH MICROSCOPIC
Bacteria, UA: NONE SEEN
Bilirubin Urine: NEGATIVE
Glucose, UA: NEGATIVE mg/dL
Ketones, ur: NEGATIVE mg/dL
Nitrite: NEGATIVE
Protein, ur: 30 mg/dL — AB
RBC / HPF: >50 RBC/hpf — ABNORMAL HIGH (ref 0–5)
Specific Gravity, Urine: 1.026 (ref 1.005–1.030)
Squamous Epithelial / HPF: NONE SEEN (ref 0–5)
pH: 6 (ref 5.0–8.0)

## 2019-06-06 LAB — SARS CORONAVIRUS 2 BY RT PCR (HOSPITAL ORDER, PERFORMED IN ~~LOC~~ HOSPITAL LAB): SARS Coronavirus 2: NEGATIVE

## 2019-06-06 MED ORDER — ONDANSETRON HCL 4 MG PO TABS: 4.0000 mg | ORAL_TABLET | Freq: Four times a day (QID) | ORAL | Status: DC | PRN

## 2019-06-06 MED ORDER — KETOROLAC TROMETHAMINE 30 MG/ML IJ SOLN
30.0000 mg | Freq: Once | INTRAMUSCULAR | Status: AC
  Administered 2019-06-06: 30 mg via INTRAVENOUS
  Filled 2019-06-06: qty 1

## 2019-06-06 MED ORDER — MORPHINE SULFATE (PF) 4 MG/ML IV SOLN: 4.0000 mg | INTRAVENOUS | Status: DC | PRN

## 2019-06-06 MED ORDER — SODIUM CHLORIDE 0.9 % IV BOLUS
1000.0000 mL | Freq: Once | INTRAVENOUS | Status: AC
  Administered 2019-06-06: 03:00:00 1000 mL via INTRAVENOUS

## 2019-06-06 MED ORDER — ONDANSETRON HCL 4 MG/2ML IJ SOLN
4.0000 mg | INTRAMUSCULAR | Status: AC
  Administered 2019-06-06: 4 mg via INTRAVENOUS
  Filled 2019-06-06: qty 2

## 2019-06-06 MED ORDER — HYDROMORPHONE HCL 1 MG/ML IJ SOLN
1.0000 mg | INTRAMUSCULAR | Status: AC
  Administered 2019-06-06: 1 mg via INTRAVENOUS
  Filled 2019-06-06: qty 1

## 2019-06-06 MED ORDER — DOCUSATE SODIUM 100 MG PO CAPS: 100.0000 mg | ORAL_CAPSULE | Freq: Two times a day (BID) | ORAL | Status: DC

## 2019-06-06 MED ORDER — HYDROMORPHONE HCL 1 MG/ML IJ SOLN: 1.0000 mg | INTRAMUSCULAR | Status: DC | PRN

## 2019-06-06 MED ORDER — ACETAMINOPHEN 325 MG PO TABS: 650.0000 mg | ORAL_TABLET | Freq: Four times a day (QID) | ORAL | Status: DC | PRN

## 2019-06-06 MED ORDER — SODIUM CHLORIDE 0.9 % IV SOLN
1.5000 mg/kg | Freq: Once | INTRAVENOUS | Status: AC
  Administered 2019-06-06: 02:00:00 114 mg via INTRAVENOUS
  Filled 2019-06-06: qty 5.7

## 2019-06-06 MED ORDER — FLEET ENEMA 7-19 GM/118ML RE ENEM: 1.0000 | ENEMA | Freq: Once | RECTAL | 0 refills | Status: AC

## 2019-06-06 MED ORDER — ACETAMINOPHEN 650 MG RE SUPP: 650.0000 mg | Freq: Four times a day (QID) | RECTAL | Status: DC | PRN

## 2019-06-06 MED ORDER — SODIUM CHLORIDE 0.9 % IV SOLN
INTRAVENOUS | Status: DC
  Administered 2019-06-06: 08:00:00 via INTRAVENOUS

## 2019-06-06 MED ORDER — ONDANSETRON HCL 4 MG/2ML IJ SOLN: 4.0000 mg | Freq: Four times a day (QID) | INTRAMUSCULAR | Status: DC | PRN

## 2019-06-06 MED ORDER — MORPHINE SULFATE (PF) 4 MG/ML IV SOLN
4.0000 mg | Freq: Once | INTRAVENOUS | Status: AC
  Administered 2019-06-06: 4 mg via INTRAVENOUS
  Filled 2019-06-06: qty 1

## 2019-06-06 NOTE — H&P (View-Only) (Signed)
   Urology Consult  I have been asked to see the patient by Dr. Sudini for evaluation and management of distal right ureteral stone.  Chief Complaint: right flank pain and emesis  History of Present Illness: Michael [name] is a 23 y.o. year old with PMH nephrolithiasis who presented to the ED on 7/14 with complaints of sudden onset severe left flank pain with emesis. He denies fevers or chills. Patient previously had a kidney stone in 2019 and was able to pass it spontaneously.  CT abdomen pelvis on 7/15 revealed an 11mm distal right ureteral obstructing stone, density 600HU, with right nephromegaly and hydronephrosis.  WBCs 9.5 at the time of admission, creatinine 0.91.  Today, patient states he is feeling better than he did yesterday.  While he states the pain last night was localized to his lower right flank, he does report some radiation to his penis at the time.  He states the penile pain has since resolved and the pain is currently located only in his flank.  He states that the pain has been well managed since his admission, and he appears comfortable upon my visit today.  Additionally, he does report some constipation.  Past Medical History:  Diagnosis Date  . ADHD (attention deficit hyperactivity disorder)   . Allergy   . Chronic headache   . GERD (gastroesophageal reflux disease)   . History of kidney stones   . PTSD (post-traumatic stress disorder)     Past Surgical History:  Procedure Laterality Date  . TONSILLECTOMY AND ADENOIDECTOMY      Home Medications:  No outpatient medications have been marked as taking for the 06/05/19 encounter (Hospital Encounter).    Allergies:  Allergies  Allergen Reactions  . Penicillins Other (See Comments)    Has patient had a PCN reaction causing immediate rash, facial/tongue/throat swelling, SOB or lightheadedness with hypotension: Unknown Has patient had a PCN reaction causing severe rash involving mucus membranes or skin necrosis:  Unknown Has patient had a PCN reaction that required hospitalization: Unknown Has patient had a PCN reaction occurring within the last 10 years: Unknown If all of the above answers are "NO", then may proceed with Cephalosporin use.   . Tramadol Hcl     Family History  Problem Relation Age of Onset  . Depression Mother   . Hyperlipidemia Mother   . Arthritis Mother   . Mitral valve prolapse Mother   . Alcohol abuse Father   . Epilepsy Brother   . AAA (abdominal aortic aneurysm) Brother     Social History:  reports that he has been smoking cigars. He has never used smokeless tobacco. He reports current drug use. Drug: Marijuana. He reports that he does not drink alcohol.  ROS: A complete review of systems was performed.  All systems are negative except for pertinent findings as noted.  Physical Exam:  Vital signs in last 24 hours: Temp:  [98.2 F (36.8 C)-98.5 F (36.9 C)] 98.2 F (36.8 C) (07/15 0756) Pulse Rate:  [56-81] 56 (07/15 0756) Resp:  [11-25] 25 (07/15 0000) BP: (126-157)/(77-94) 126/79 (07/15 0756) SpO2:  [93 %-100 %] 98 % (07/15 0756) Weight:  [75.8 kg-83.1 kg] 83.1 kg (07/15 0640) Constitutional:  Alert and oriented, comfortable appearing, no acute distress HEENT: [place] AT, moist mucus membranes.  Trachea midline, no masses Cardiovascular: No clubbing, cyanosis, or edema. Respiratory: Normal respiratory effort Skin: No rashes, bruises or suspicious lesions Neurologic: Grossly intact, no focal deficits, moving all 4 extremities Psychiatric: Normal mood   and affect  Laboratory Data:  Recent Labs    06/05/19 2351  WBC 9.5  HGB 14.8  HCT 44.1   Recent Labs    06/05/19 2351  NA 141  K 3.1*  CL 104  CO2 28  GLUCOSE 109*  BUN 14  CREATININE 0.91  CALCIUM 8.7*   Results for orders placed or performed during the hospital encounter of 06/05/19  SARS Coronavirus 2 (CEPHEID - Performed in [place] hospital lab), Hosp Order     Status: None   Collection  Time: 06/06/19  1:38 AM   Specimen: Nasopharyngeal Swab  Result Value Ref Range Status   SARS Coronavirus 2 NEGATIVE NEGATIVE Final    Comment: (NOTE) If result is NEGATIVE SARS-CoV-2 target nucleic acids are NOT DETECTED. The SARS-CoV-2 RNA is generally detectable in upper and lower  respiratory specimens during the acute phase of infection. The lowest  concentration of SARS-CoV-2 viral copies this assay can detect is 250  copies / mL. A negative result does not preclude SARS-CoV-2 infection  and should not be used as the sole basis for treatment or other  patient management decisions.  A negative result may occur with  improper specimen collection / handling, submission of specimen other  than nasopharyngeal swab, presence of viral mutation(s) within the  areas targeted by this assay, and inadequate number of viral copies  (<250 copies / mL). A negative result must be combined with clinical  observations, patient history, and epidemiological information. If result is POSITIVE SARS-CoV-2 target nucleic acids are DETECTED. The SARS-CoV-2 RNA is generally detectable in upper and lower  respiratory specimens dur ing the acute phase of infection.  Positive  results are indicative of active infection with SARS-CoV-2.  Clinical  correlation with patient history and other diagnostic information is  necessary to determine patient infection status.  Positive results do  not rule out bacterial infection or co-infection with other viruses. If result is PRESUMPTIVE POSTIVE SARS-CoV-2 nucleic acids MAY BE PRESENT.   A presumptive positive result was obtained on the submitted specimen  and confirmed on repeat testing.  While 2019 novel coronavirus  (SARS-CoV-2) nucleic acids may be present in the submitted sample  additional confirmatory testing may be necessary for epidemiological  and / or clinical management purposes  to differentiate between  SARS-CoV-2 and other Sarbecovirus currently known  to infect humans.  If clinically indicated additional testing with an alternate test  methodology [phone]) is advised. The SARS-CoV-2 RNA is generally  detectable in upper and lower respiratory sp ecimens during the acute  phase of infection. The expected result is Negative. Fact Sheet for Patients:  StrictlyIdeas.no Fact Sheet for Healthcare Providers: BankingDealers.co.za This test is not yet approved or cleared by the [place] FDA and has been authorized for detection and/or diagnosis of SARS-CoV-2 by FDA under an Emergency Use Authorization (EUA).  This EUA will remain in effect (meaning this test can be used) for the duration of the COVID-19 declaration under Section 564(b)(1) of the Act, 21 U.S.C. section 360bbb-3(b)(1), unless the authorization is terminated or revoked sooner. Performed at [facility], [address], [place], [place] [address]      Radiologic Imaging: Ct Renal Stone Study  Result Date: 06/06/2019 CLINICAL DATA:  23 year old male with right flank pain radiating to the scrotum. EXAM: CT ABDOMEN AND PELVIS WITHOUT CONTRAST TECHNIQUE: Multidetector CT imaging of the abdomen and pelvis was performed following the standard protocol without IV contrast. COMPARISON:  CT Abdomen and Pelvis 12/08/2017.  FINDINGS: Lower chest: Lower lung volumes with mild lower lobe atelectasis. Hepatobiliary: Negative noncontrast liver and gallbladder. Pancreas: Negative. Spleen: Negative. Adrenals/Urinary Tract: Normal adrenal glands. Negative noncontrast left kidney. Right nephro megaly and hydronephrosis with evidence of renal edema. Right hydroureter, which continues into the pelvis where an obstructing calculus in the distal ureter measures up to 11 millimeters (coronal image 61 and series 2, image 81). This is located about 1 centimeter proximal to the ureterovesical junction. Diminutive and unremarkable urinary bladder.  Stomach/Bowel: Negative large bowel. Normal appendix on series 2, image 61. Negative terminal ileum. No dilated small bowel. Decompressed stomach. No free air or free fluid. Vascular/Lymphatic: Vascular patency is not evaluated in the absence of IV contrast. No lymphadenopathy. Reproductive: Negative. Other: No pelvic free fluid. Musculoskeletal: No acute osseous abnormality identified. IMPRESSION: 1. Acute obstructive uropathy on the right due to a large 11 mm calculus in the distal right ureter about 1 cm proximal to the UVJ. 2. No other urinary calculus identified. Normal appendix. Electronically Signed   By: Odessa FlemingH  Hall M.D.   On: 06/06/2019 00:45    Assessment & Plan:  1. Distal right ureteral obstructing stone, 11mm Given the stone size of 11 mm, it is very unlikely that he will achieve spontaneous passage.  Counseled the patient that he is an excellent candidate for stone management interventions.  Given the stone density and patient's body habitus, he is a candidate for ESWL or ureteroscopy.  Counseled the patient that ESWL is a less invasive procedure, however it comes with a 70 to 75% chance of stone clearance.  Stated that by comparison, ureteroscopy carries a greater than 95% chance of stone clearance, but that he would require a stent after the procedure.  Patient prefers to proceed with ESWL tomorrow.  As his pain is currently well managed, he would like to be discharged today and return tomorrow as an outpatient for the procedure.  I am amenable to this plan.  Prior to discharge today, I will require a urinalysis and urine culture.  Counseled the patient that existing urinary tract infection may change his management plan.  His vitals and labs are unconcerning for infection, so my clinical suspicion for this is low. -Obtain urinalysis and urine culture prior to discharge -Schedule patient for outpatient ESWL tomorrow, 06/07/2019 -Patient should use an enema prior to procedure tomorrow to relieve  his constipation and increase stone visualization  06/06/2019, 11:15 AM  Carman ChingSamantha Salah Burlison, PA-C   Thank you for involving me in this patient's care, I will continue to follow along. Carman ChingSamantha Aldine Chakraborty

## 2019-06-06 NOTE — Discharge Instructions (Signed)
Urology office will call you with your appointment for tomorrow

## 2019-06-06 NOTE — ED Provider Notes (Signed)
[facility] Emergency Department Provider Note  ____________________________________________   First MD Initiated Contact with Patient 06/05/19 2357     (approximate)  I have reviewed the triage vital signs and the nursing notes.   HISTORY  Chief Complaint Abdominal Pain  Level 5 caveat:  history/ROS limited by acute/critical illness  HPI Michael [name] is a 23 y.o. male with medical history as listed below  who presents for evaluation of severe sharp stabbing pain in the right side of his back.  It started earlier today, possibly about 12 hours ago although the patient is in too much pain to give much history.  It has been constant although it has been waxing and waning and recently got you more severe.  He has had nausea and vomiting.  Nothing particular makes his symptoms better or worse.  He forgot that he has a history of kidney stones but I found in the medical record.  He has had no burning when he urinates and no blood in the urine that he has seen.  He says he was tested for COVID-19 a few days ago and it was negative and he has had no chest pain, fever, cough, shortness of breath, nor sore throat.        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 Authorizing Provider  brompheniramine-pseudoephedrine-DM 30-2-10 MG/5ML syrup Take 5 mLs by mouth 4 (four) times daily as needed. 10/16/18   Duanne Guess, PA-C  clindamycin (CLEOCIN) 150 MG capsule Take 2 capsules (300 mg total) by mouth 3 (three) times daily. 03/18/19   Fisher, Linden Dolin, PA-C  dicyclomine (BENTYL) 10 MG capsule Take 1 capsule (10 mg total) by mouth 4 (four) times daily -  before meals and at bedtime. 01/10/18 02/09/18  Lin Landsman, MD  ibuprofen (ADVIL,MOTRIN) 600 MG tablet Take 1 tablet (600 mg total) by mouth every 8 (eight) hours as needed. 10/25/18   Darel Hong, MD  ondansetron (ZOFRAN ODT) 4 MG disintegrating tablet Take 1 tablet (4 mg total) by mouth every 8 (eight) hours as needed for nausea or vomiting. 10/25/18   Darel Hong, MD  ondansetron (ZOFRAN) 4 MG tablet Take 1 tablet (4 mg total) by mouth every 8 (eight) hours as needed for nausea or vomiting. 01/03/18   Lisa Roca, MD  sucralfate (CARAFATE) 1 g tablet Take 1 tablet (1 g total) by mouth 4 (four) times daily. 01/03/18 01/03/19  Lisa Roca, MD    Allergies Penicillins and Tramadol hcl  Family History  Problem Relation Age of Onset   Depression Mother    Hyperlipidemia Mother  Arthritis Mother    Mitral valve prolapse Mother    Alcohol abuse Father    Epilepsy Brother    AAA (abdominal aortic aneurysm) Brother     Social History Social History   Tobacco Use   Smoking status: Current Every Day Smoker    Types: Cigars   Smokeless tobacco: Never Used  Substance Use Topics   Alcohol use: No    Alcohol/week: 0.0 standard drinks   Drug use: Yes    Types: Marijuana    Review of Systems Level 5 caveat:  history/ROS limited by acute/critical illness  Constitutional: No fever/chills Eyes: No  visual changes. ENT: No sore throat. Cardiovascular: Denies chest pain. Respiratory: Denies shortness of breath. Gastrointestinal: No abdominal pain.  No nausea, no vomiting.  No diarrhea.  No constipation. Genitourinary: Negative for dysuria. Musculoskeletal: Severe pain in the right flank. Integumentary: Negative for rash. Neurological: Negative for headaches, focal weakness or numbness.   ____________________________________________   PHYSICAL EXAM:  VITAL SIGNS: ED Triage Vitals  Enc Vitals Group     BP 06/05/19 2344 140/77     Pulse Rate 06/05/19 2344 60     Resp 06/05/19 2344 11     Temp 06/05/19 2344 98.3 F (36.8 C)     Temp Source 06/05/19 2344 Oral     SpO2 06/05/19 2344 93 %     Weight 06/05/19 2343 75.8 kg (167 lb)     Height 06/05/19 2343 1.803 m (5\' 11" )     Head Circumference --      Peak Flow --      Pain Score 06/05/19 2343 10     Pain Loc --      Pain Edu? --      Excl. in GC? --     Constitutional: Alert and oriented.  Severe distress from pain. Eyes: Conjunctivae are normal.  Head: Atraumatic. Nose: No congestion/rhinnorhea. Mouth/Throat: Mucous membranes are moist. Neck: No stridor.  No meningeal signs.   Cardiovascular: Normal rate, regular rhythm. Good peripheral circulation. Grossly normal heart sounds. Respiratory: Normal respiratory effort.  No retractions. No audible wheezing. Gastrointestinal: Soft and nontender. No distention.  Musculoskeletal: Right CVA tenderness to percussion.  No lower extremity tenderness nor edema. No gross deformities of extremities. Neurologic:  Normal speech and language. No gross focal neurologic deficits are appreciated.  Skin:  Skin is warm, dry and intact. No rash noted. Psychiatric: Patient is in a lot of pain and unwilling or unable to participate very much and physical exam and history.  ____________________________________________   LABS (all labs ordered are listed, but only abnormal results are  displayed)  Labs Reviewed  COMPREHENSIVE METABOLIC PANEL - Abnormal; Notable for the following components:      Result Value   Potassium 3.1 (*)    Glucose, Bld 109 (*)    Calcium 8.7 (*)    All other components within normal limits  SARS CORONAVIRUS 2 (HOSPITAL ORDER, PERFORMED IN  HOSPITAL LAB)  LIPASE, BLOOD  CBC  URINALYSIS, COMPLETE (UACMP) WITH MICROSCOPIC   ____________________________________________  EKG  No indication for EKG ____________________________________________  RADIOLOGY   ED MD interpretation: 11 mm obstructive calculus in right ureter.  Official radiology report(s): Ct Renal Stone Study  Result Date: 06/06/2019 CLINICAL DATA:  23 year old male with right flank pain radiating to the scrotum. EXAM: CT ABDOMEN AND PELVIS WITHOUT CONTRAST TECHNIQUE: Multidetector CT imaging of the abdomen and pelvis was performed following the standard protocol without IV contrast. COMPARISON:  CT Abdomen and  Pelvis 12/08/2017. FINDINGS: Lower chest: Lower lung volumes with mild lower lobe atelectasis. Hepatobiliary: Negative noncontrast liver and gallbladder. Pancreas: Negative. Spleen: Negative. Adrenals/Urinary Tract: Normal adrenal glands. Negative noncontrast left kidney. Right nephro megaly and hydronephrosis with evidence of renal edema. Right hydroureter, which continues into the pelvis where an obstructing calculus in the distal ureter measures up to 11 millimeters (coronal image 61 and series 2, image 81). This is located about 1 centimeter proximal to the ureterovesical junction. Diminutive and unremarkable urinary bladder. Stomach/Bowel: Negative large bowel. Normal appendix on series 2, image 61. Negative terminal ileum. No dilated small bowel. Decompressed stomach. No free air or free fluid. Vascular/Lymphatic: Vascular patency is not evaluated in the absence of IV contrast. No lymphadenopathy. Reproductive: Negative. Other: No pelvic free fluid.  Musculoskeletal: No acute osseous abnormality identified. IMPRESSION: 1. Acute obstructive uropathy on the right due to a large 11 mm calculus in the distal right ureter about 1 cm proximal to the UVJ. 2. No other urinary calculus identified. Normal appendix. Electronically Signed   By: Odessa FlemingH  Hall M.D.   On: 06/06/2019 00:45    ____________________________________________   PROCEDURES   Procedure(s) performed (including Critical Care):  Procedures   ____________________________________________   INITIAL IMPRESSION / MDM / ASSESSMENT AND PLAN / ED COURSE  As part of my medical decision making, I reviewed the following data within the electronic MEDICAL RECORD NUMBER Nursing notes reviewed and incorporated, Old chart reviewed, Discussed with urologist (Dr. Lonna CobbStoioff) , Discussed with admitting physician , Notes from prior ED visits and [place] Controlled Substance Database   Differential diagnosis includes, but is not limited to, ureteral colic, UTI/pyelonephritis.  The patient is in a great deal of pain.  So far he is received fentanyl 100 mcg IV, morphine 4 mg IV, Zofran 4 mg IV.  He is still in a lot of distress.  I have ordered lidocaine 1.5 mg/kg according to protocol for renal colic and I am ordering another Dilaudid 1 mg IV.  I am ordering a rapid COVID swab in case the patient requires a ureteral stent.  I will reassess after he is received both the Dilaudid and the lidocaine.  I am holding off on Toradol in case he may be eligible for lithotripsy but he may require the Toradol and attempt to control his pain.  I anticipate a phone discussion with Dr. Lonna CobbStoioff with urology.      Clinical Course as of Jun 06 731  Wed Jun 06, 2019  0246 SARS Coronavirus 2: NEGATIVE [CF]  [phone] The patient is still in pain although it has improved after 3 rounds of narcotics and lidocaine 1.5 mg/kg IV.  I called and spoke by phone with Dr. Lonna CobbStoioff.  I read him the results of the CT scan and he encouraged me to go  ahead and give Toradol so I have ordered Toradol 30 mg IV.  I pointed out that it may be contraindicated for lithotripsy but he told me he would be okay to do it.  He said that if the pain is still uncontrolled after the Toradol I can but to the hospitalist for pain management and they can consult him in the morning and I will look at the OR schedule for ureteral stent.  I am also ordering 1 L normal saline and will try to get a urine specimen.   [CF]  0451 Patient appears more comfortable but still reports severe pain when he moves around.  He has not yet provided urine.  No evidence of sepsis.Given the amount of medication he has received affect pain is still intractable but he does not qualify for an emergency ureteral stent, I have sent a [facility] secure chat message to Dr. Sheryle Haildiamond with the hospitalist service for admission as per my conversation with Dr. Lonna CobbStoioff.   [CF]    Clinical Course User Index [CF] Loleta RoseForbach, Supreme Rybarczyk, MD     ____________________________________________  FINAL CLINICAL IMPRESSION(S) / ED DIAGNOSES  Final diagnoses:  Ureteral stone with hydronephrosis  Intractable pain     MEDICATIONS GIVEN DURING THIS VISIT:  Medications  lidocaine (XYLOCAINE) 114 mg in sodium chloride 0.9 % 100 mL IVPB (has no administration in time range)  HYDROmorphone (DILAUDID) injection 1 mg (has no administration in time range)  morphine 4 MG/ML injection 4 mg (4 mg Intravenous Given 06/06/19 0022)  ondansetron (ZOFRAN) injection 4 mg (4 mg Intravenous Given 06/06/19 0020)     ED Discharge Orders    None      *Please note:  Thedford Shary DecampJ Pickron was evaluated in Emergency Department on 06/06/2019 for the symptoms described in the history of present illness. He was evaluated in the context of the global COVID-19 pandemic, which necessitated consideration that the patient might be at risk for infection with the SARS-CoV-2 virus that causes COVID-19. Institutional protocols and algorithms that pertain to  the evaluation of patients at risk for COVID-19 are in a state of rapid change based on information released by regulatory bodies including the CDC and federal and state organizations. These policies and algorithms were followed during the patient's care in the ED.  Some ED evaluations and interventions may be delayed as a result of limited staffing during the pandemic.*  Note:  This document was prepared using Dragon voice recognition software and may include unintentional dictation errors.   Loleta RoseForbach, Masey Scheiber, MD 06/06/19 [id number]

## 2019-06-06 NOTE — H&P (Signed)
Deren Shary DecampJ Pree is an 23 y.o. male.   Chief Complaint: Abdominal pain HPI: The patient with past medical history of kidney stones presents to the emergency department complaining of pain in his abdomen that radiates into his back/flank and down into his scrotum.  The patient would not share details of his symptoms with me but he has not had any fever, nausea or vomiting.  CT of the patient's abdomen revealed large kidney stone at the right UPJ.  Urology was consulted who recommended admission for pain control and lithotripsy.  Past Medical History:  Diagnosis Date  . ADHD (attention deficit hyperactivity disorder)   . Allergy   . Chronic headache   . GERD (gastroesophageal reflux disease)   . History of kidney stones   . PTSD (post-traumatic stress disorder)     Past Surgical History:  Procedure Laterality Date  . TONSILLECTOMY AND ADENOIDECTOMY      Family History  Problem Relation Age of Onset  . Depression Mother   . Hyperlipidemia Mother   . Arthritis Mother   . Mitral valve prolapse Mother   . Alcohol abuse Father   . Epilepsy Brother   . AAA (abdominal aortic aneurysm) Brother    Social History:  reports that he has been smoking cigars. He has never used smokeless tobacco. He reports current drug use. Drug: Marijuana. He reports that he does not drink alcohol.  Allergies:  Allergies  Allergen Reactions  . Penicillins Other (See Comments)    Has patient had a PCN reaction causing immediate rash, facial/tongue/throat swelling, SOB or lightheadedness with hypotension: Unknown Has patient had a PCN reaction causing severe rash involving mucus membranes or skin necrosis: Unknown Has patient had a PCN reaction that required hospitalization: Unknown Has patient had a PCN reaction occurring within the last 10 years: Unknown If all of the above answers are "NO", then may proceed with Cephalosporin use.   . Tramadol Hcl     No medications prior to admission.    Results for  orders placed or performed during the hospital encounter of 06/05/19 (from the past 48 hour(s))  Lipase, blood     Status: None   Collection Time: 06/05/19 11:51 PM  Result Value Ref Range   Lipase 27 11 - 51 U/L    Comment: Performed at [facility], [address] [place], [place] [address]  Comprehensive metabolic panel     Status: Abnormal   Collection Time: 06/05/19 11:51 PM  Result Value Ref Range   Sodium 141 135 - 145 mmol/L   Potassium 3.1 (L) 3.5 - 5.1 mmol/L   Chloride 104 98 - 111 mmol/L   CO2 28 22 - 32 mmol/L   Glucose, Bld 109 (H) 70 - 99 mg/dL   BUN 14 6 - 20 mg/dL   Creatinine, Ser 6.040.91 0.61 - 1.24 mg/dL   Calcium 8.7 (L) 8.9 - 10.3 mg/dL   Total Protein 6.8 6.5 - 8.1 g/dL   Albumin 4.1 3.5 - 5.0 g/dL   AST 25 15 - 41 U/L   ALT 21 0 - 44 U/L   Alkaline Phosphatase 92 38 - 126 U/L   Total Bilirubin 0.9 0.3 - 1.2 mg/dL   GFR calc non Af Amer >60 >60 mL/min   GFR calc Af Amer >60 >60 mL/min   Anion gap 9 5 - 15    Comment: Performed at [facility], [address] [place], [place] [address]  CBC     Status: None  Collection Time: 06/05/19 11:51 PM  Result Value Ref Range   WBC 9.5 4.0 - 10.5 K/uL   RBC 4.67 4.22 - 5.81 MIL/uL   Hemoglobin 14.8 13.0 - 17.0 g/dL   HCT 16.144.1 09.639.0 - 04.552.0 %   MCV 94.4 80.0 - 100.0 fL   MCH 31.7 26.0 - 34.0 pg   MCHC 33.6 30.0 - 36.0 g/dL   RDW 40.912.7 81.111.5 - 91.415.5 %   Platelets 164 150 - 400 K/uL   nRBC 0.0 0.0 - 0.2 %    Comment: Performed at [facility], [address] [place], [place] [address]  SARS Coronavirus 2 (CEPHEID - Performed in [facility] hospital lab), Hosp Order     Status: None   Collection Time: 06/06/19  1:38 AM   Specimen: Nasopharyngeal Swab  Result Value Ref Range   SARS Coronavirus 2 NEGATIVE NEGATIVE    Comment: (NOTE) If result is NEGATIVE SARS-CoV-2 target nucleic acids are NOT DETECTED. The SARS-CoV-2 RNA is generally detectable in upper and lower  respiratory  specimens during the acute phase of infection. The lowest  concentration of SARS-CoV-2 viral copies this assay can detect is 250  copies / mL. A negative result does not preclude SARS-CoV-2 infection  and should not be used as the sole basis for treatment or other  patient management decisions.  A negative result may occur with  improper specimen collection / handling, submission of specimen other  than nasopharyngeal swab, presence of viral mutation(s) within the  areas targeted by this assay, and inadequate number of viral copies  (<250 copies / mL). A negative result must be combined with clinical  observations, patient history, and epidemiological information. If result is POSITIVE SARS-CoV-2 target nucleic acids are DETECTED. The SARS-CoV-2 RNA is generally detectable in upper and lower  respiratory specimens dur ing the acute phase of infection.  Positive  results are indicative of active infection with SARS-CoV-2.  Clinical  correlation with patient history and other diagnostic information is  necessary to determine patient infection status.  Positive results do  not rule out bacterial infection or co-infection with other viruses. If result is PRESUMPTIVE POSTIVE SARS-CoV-2 nucleic acids MAY BE PRESENT.   A presumptive positive result was obtained on the submitted specimen  and confirmed on repeat testing.  While 2019 novel coronavirus  (SARS-CoV-2) nucleic acids may be present in the submitted sample  additional confirmatory testing may be necessary for epidemiological  and / or clinical management purposes  to differentiate between  SARS-CoV-2 and other Sarbecovirus currently known to infect humans.  If clinically indicated additional testing with an alternate test  methodology [phone] is advised. The SARS-CoV-2 RNA is generally  detectable in upper and lower respiratory sp ecimens during the acute  phase of infection. The expected result is Negative. Fact Sheet for  Patients:  BoilerBrush.com.cyhttps://www.fda.gov/media/136312/download Fact Sheet for Healthcare Providers: https://pope.com/https://www.fda.gov/media/136313/download This test is not yet approved or cleared by the [place] FDA and has been authorized for detection and/or diagnosis of SARS-CoV-2 by FDA under an Emergency Use Authorization (EUA).  This EUA will remain in effect (meaning this test can be used) for the duration of the COVID-19 declaration under Section 564(b)(1) of the Act, 21 U.S.C. section 360bbb-3(b)(1), unless the authorization is terminated or revoked sooner. Performed at [facility], [address] [place], [place] [address]    Ct Renal Soundra PilonStone Study  Result Date: 06/06/2019 CLINICAL DATA:  23 year old male with right flank pain radiating to the scrotum. EXAM:  CT ABDOMEN AND PELVIS WITHOUT CONTRAST TECHNIQUE: Multidetector CT imaging of the abdomen and pelvis was performed following the standard protocol without IV contrast. COMPARISON:  CT Abdomen and Pelvis 12/08/2017. FINDINGS: Lower chest: Lower lung volumes with mild lower lobe atelectasis. Hepatobiliary: Negative noncontrast liver and gallbladder. Pancreas: Negative. Spleen: Negative. Adrenals/Urinary Tract: Normal adrenal glands. Negative noncontrast left kidney. Right nephro megaly and hydronephrosis with evidence of renal edema. Right hydroureter, which continues into the pelvis where an obstructing calculus in the distal ureter measures up to 11 millimeters (coronal image 61 and series 2, image 81). This is located about 1 centimeter proximal to the ureterovesical junction. Diminutive and unremarkable urinary bladder. Stomach/Bowel: Negative large bowel. Normal appendix on series 2, image 61. Negative terminal ileum. No dilated small bowel. Decompressed stomach. No free air or free fluid. Vascular/Lymphatic: Vascular patency is not evaluated in the absence of IV contrast. No lymphadenopathy. Reproductive: Negative. Other: No pelvic free  fluid. Musculoskeletal: No acute osseous abnormality identified. IMPRESSION: 1. Acute obstructive uropathy on the right due to a large 11 mm calculus in the distal right ureter about 1 cm proximal to the UVJ. 2. No other urinary calculus identified. Normal appendix. Electronically Signed   By: Genevie Ann M.D.   On: 06/06/2019 00:45    Review of Systems  Constitutional: Negative for chills and fever.  HENT: Negative for sore throat and tinnitus.   Eyes: Negative for blurred vision and redness.  Respiratory: Negative for cough and shortness of breath.   Cardiovascular: Negative for chest pain, palpitations, orthopnea and PND.  Gastrointestinal: Negative for abdominal pain, diarrhea, nausea and vomiting.  Genitourinary: Negative for dysuria, frequency and urgency.  Musculoskeletal: Positive for back pain. Negative for joint pain and myalgias.  Skin: Negative for rash.       No lesions  Neurological: Negative for speech change, focal weakness and weakness.  Endo/Heme/Allergies: Does not bruise/bleed easily.       No temperature intolerance  Psychiatric/Behavioral: Negative for depression and suicidal ideas.    Blood pressure (!) 157/94, pulse 81, temperature 98.5 F (36.9 C), temperature source Oral, resp. rate (!) 25, height 5\' 11"  (1.803 m), weight 75.8 kg, SpO2 100 %. Physical Exam  Vitals reviewed. Constitutional: He is oriented to person, place, and time. He appears well-developed and well-nourished. No distress.  HENT:  Head: Normocephalic and atraumatic.  Mouth/Throat: Oropharynx is clear and moist.  Eyes: Pupils are equal, round, and reactive to light. Conjunctivae and EOM are normal. No scleral icterus.  Neck: Normal range of motion. Neck supple. No JVD present. No tracheal deviation present. No thyromegaly present.  Cardiovascular: Normal rate, regular rhythm and normal heart sounds. Exam reveals no gallop and no friction rub.  No murmur heard. Respiratory: Effort normal and breath  sounds normal. No respiratory distress.  GI: Soft. Bowel sounds are normal. He exhibits no distension. There is no abdominal tenderness.  Genitourinary:    Genitourinary Comments: Deferred   Musculoskeletal: Normal range of motion.        General: No edema.  Lymphadenopathy:    He has no cervical adenopathy.  Neurological: He is alert and oriented to person, place, and time. No cranial nerve deficit.  Skin: Skin is warm and dry. No rash noted. No erythema.  Psychiatric: He has a normal mood and affect. His behavior is normal. Judgment and thought content normal.     Assessment/Plan This is a 23 year old male admitted for obstructing kidney stone. 1.  Renal calculus: 11 mm stone at  right UPJ; no sign of urinary tract infection.  Manage severe pain with IV morphine.  Consult urology 2.  Hypokalemia: Replete potassium 3.  Elevated blood pressure: Secondary to pain; continue to monitor 4.  DVT prophylaxis: The patient is ambulatory 5.  GI prophylaxis: None The patient is a full code.  Time spent on admission orders and patient care approximately 45 minutes  Arnaldo Nataliamond,  Janelle Spellman S, MD 06/06/2019, 6:43 AM

## 2019-06-06 NOTE — ED Notes (Signed)
pts family updated at this time 

## 2019-06-06 NOTE — Progress Notes (Signed)
Patient discharged home as ordered,instructions explained and well understood,vital signs within normal limits upon discharge 

## 2019-06-06 NOTE — Consult Note (Addendum)
Urology Consult  I have been asked to see the patient by Dr. Elpidio AnisSudini for evaluation and management of distal right ureteral stone.  Chief Complaint: right flank pain and emesis  History of Present Illness: Michael [name] is a 23 y.o. year old with PMH nephrolithiasis who presented to the ED on 7/14 with complaints of sudden onset severe left flank pain with emesis. He denies fevers or chills. Patient previously had a kidney stone in 2019 and was able to pass it spontaneously.  CT abdomen pelvis on 7/15 revealed an 11mm distal right ureteral obstructing stone, density 600HU, with right nephromegaly and hydronephrosis.  WBCs 9.5 at the time of admission, creatinine 0.91.  Today, patient states he is feeling better than he did yesterday.  While he states the pain last night was localized to his lower right flank, he does report some radiation to his penis at the time.  He states the penile pain has since resolved and the pain is currently located only in his flank.  He states that the pain has been well managed since his admission, and he appears comfortable upon my visit today.  Additionally, he does report some constipation.  Past Medical History:  Diagnosis Date  . ADHD (attention deficit hyperactivity disorder)   . Allergy   . Chronic headache   . GERD (gastroesophageal reflux disease)   . History of kidney stones   . PTSD (post-traumatic stress disorder)     Past Surgical History:  Procedure Laterality Date  . TONSILLECTOMY AND ADENOIDECTOMY      Home Medications:  No outpatient medications have been marked as taking for the 06/05/19 encounter [facility](Hospital Encounter).    Allergies:  Allergies  Allergen Reactions  . Penicillins Other (See Comments)    Has patient had a PCN reaction causing immediate rash, facial/tongue/throat swelling, SOB or lightheadedness with hypotension: Unknown Has patient had a PCN reaction causing severe rash involving mucus membranes or skin necrosis:  Unknown Has patient had a PCN reaction that required hospitalization: Unknown Has patient had a PCN reaction occurring within the last 10 years: Unknown If all of the above answers are "NO", then may proceed with Cephalosporin use.   . Tramadol Hcl     Family History  Problem Relation Age of Onset  . Depression Mother   . Hyperlipidemia Mother   . Arthritis Mother   . Mitral valve prolapse Mother   . Alcohol abuse Father   . Epilepsy Brother   . AAA (abdominal aortic aneurysm) Brother     Social History:  reports that he has been smoking cigars. He has never used smokeless tobacco. He reports current drug use. Drug: Marijuana. He reports that he does not drink alcohol.  ROS: A complete review of systems was performed.  All systems are negative except for pertinent findings as noted.  Physical Exam:  Vital signs in last 24 hours: Temp:  [98.2 F (36.8 C)-98.5 F (36.9 C)] 98.2 F (36.8 C) (07/15 0756) Pulse Rate:  [56-81] 56 (07/15 0756) Resp:  [11-25] 25 (07/15 0000) BP: (126-157)/(77-94) 126/79 (07/15 0756) SpO2:  [93 %-100 %] 98 % (07/15 0756) Weight:  [75.8 kg-83.1 kg] 83.1 kg (07/15 0640) Constitutional:  Alert and oriented, comfortable appearing, no acute distress HEENT: [place] AT, moist mucus membranes.  Trachea midline, no masses Cardiovascular: No clubbing, cyanosis, or edema. Respiratory: Normal respiratory effort Skin: No rashes, bruises or suspicious lesions Neurologic: Grossly intact, no focal deficits, moving all 4 extremities Psychiatric: Normal mood  and affect  Laboratory Data:  Recent Labs    06/05/19 2351  WBC 9.5  HGB 14.8  HCT 44.1   Recent Labs    06/05/19 2351  NA 141  K 3.1*  CL 104  CO2 28  GLUCOSE 109*  BUN 14  CREATININE 0.91  CALCIUM 8.7*   Results for orders placed or performed during the hospital encounter of 06/05/19  SARS Coronavirus 2 (CEPHEID - Performed in [place] hospital lab), Hosp Order     Status: None   Collection  Time: 06/06/19  1:38 AM   Specimen: Nasopharyngeal Swab  Result Value Ref Range Status   SARS Coronavirus 2 NEGATIVE NEGATIVE Final    Comment: (NOTE) If result is NEGATIVE SARS-CoV-2 target nucleic acids are NOT DETECTED. The SARS-CoV-2 RNA is generally detectable in upper and lower  respiratory specimens during the acute phase of infection. The lowest  concentration of SARS-CoV-2 viral copies this assay can detect is 250  copies / mL. A negative result does not preclude SARS-CoV-2 infection  and should not be used as the sole basis for treatment or other  patient management decisions.  A negative result may occur with  improper specimen collection / handling, submission of specimen other  than nasopharyngeal swab, presence of viral mutation(s) within the  areas targeted by this assay, and inadequate number of viral copies  (<250 copies / mL). A negative result must be combined with clinical  observations, patient history, and epidemiological information. If result is POSITIVE SARS-CoV-2 target nucleic acids are DETECTED. The SARS-CoV-2 RNA is generally detectable in upper and lower  respiratory specimens dur ing the acute phase of infection.  Positive  results are indicative of active infection with SARS-CoV-2.  Clinical  correlation with patient history and other diagnostic information is  necessary to determine patient infection status.  Positive results do  not rule out bacterial infection or co-infection with other viruses. If result is PRESUMPTIVE POSTIVE SARS-CoV-2 nucleic acids MAY BE PRESENT.   A presumptive positive result was obtained on the submitted specimen  and confirmed on repeat testing.  While 2019 novel coronavirus  (SARS-CoV-2) nucleic acids may be present in the submitted sample  additional confirmatory testing may be necessary for epidemiological  and / or clinical management purposes  to differentiate between  SARS-CoV-2 and other Sarbecovirus currently known  to infect humans.  If clinically indicated additional testing with an alternate test  methodology [phone]) is advised. The SARS-CoV-2 RNA is generally  detectable in upper and lower respiratory sp ecimens during the acute  phase of infection. The expected result is Negative. Fact Sheet for Patients:  StrictlyIdeas.no Fact Sheet for Healthcare Providers: BankingDealers.co.za This test is not yet approved or cleared by the [place] FDA and has been authorized for detection and/or diagnosis of SARS-CoV-2 by FDA under an Emergency Use Authorization (EUA).  This EUA will remain in effect (meaning this test can be used) for the duration of the COVID-19 declaration under Section 564(b)(1) of the Act, 21 U.S.C. section 360bbb-3(b)(1), unless the authorization is terminated or revoked sooner. Performed at [facility], [address], [place], [place] [address]      Radiologic Imaging: Ct Renal Stone Study  Result Date: 06/06/2019 CLINICAL DATA:  23 year old male with right flank pain radiating to the scrotum. EXAM: CT ABDOMEN AND PELVIS WITHOUT CONTRAST TECHNIQUE: Multidetector CT imaging of the abdomen and pelvis was performed following the standard protocol without IV contrast. COMPARISON:  CT Abdomen and Pelvis 12/08/2017.  FINDINGS: Lower chest: Lower lung volumes with mild lower lobe atelectasis. Hepatobiliary: Negative noncontrast liver and gallbladder. Pancreas: Negative. Spleen: Negative. Adrenals/Urinary Tract: Normal adrenal glands. Negative noncontrast left kidney. Right nephro megaly and hydronephrosis with evidence of renal edema. Right hydroureter, which continues into the pelvis where an obstructing calculus in the distal ureter measures up to 11 millimeters (coronal image 61 and series 2, image 81). This is located about 1 centimeter proximal to the ureterovesical junction. Diminutive and unremarkable urinary bladder.  Stomach/Bowel: Negative large bowel. Normal appendix on series 2, image 61. Negative terminal ileum. No dilated small bowel. Decompressed stomach. No free air or free fluid. Vascular/Lymphatic: Vascular patency is not evaluated in the absence of IV contrast. No lymphadenopathy. Reproductive: Negative. Other: No pelvic free fluid. Musculoskeletal: No acute osseous abnormality identified. IMPRESSION: 1. Acute obstructive uropathy on the right due to a large 11 mm calculus in the distal right ureter about 1 cm proximal to the UVJ. 2. No other urinary calculus identified. Normal appendix. Electronically Signed   By: Odessa FlemingH  Hall M.D.   On: 06/06/2019 00:45    Assessment & Plan:  1. Distal right ureteral obstructing stone, 11mm Given the stone size of 11 mm, it is very unlikely that he will achieve spontaneous passage.  Counseled the patient that he is an excellent candidate for stone management interventions.  Given the stone density and patient's body habitus, he is a candidate for ESWL or ureteroscopy.  Counseled the patient that ESWL is a less invasive procedure, however it comes with a 70 to 75% chance of stone clearance.  Stated that by comparison, ureteroscopy carries a greater than 95% chance of stone clearance, but that he would require a stent after the procedure.  Patient prefers to proceed with ESWL tomorrow.  As his pain is currently well managed, he would like to be discharged today and return tomorrow as an outpatient for the procedure.  I am amenable to this plan.  Prior to discharge today, I will require a urinalysis and urine culture.  Counseled the patient that existing urinary tract infection may change his management plan.  His vitals and labs are unconcerning for infection, so my clinical suspicion for this is low. -Obtain urinalysis and urine culture prior to discharge -Schedule patient for outpatient ESWL tomorrow, 06/07/2019 -Patient should use an enema prior to procedure tomorrow to relieve  his constipation and increase stone visualization  06/06/2019, 11:15 AM  Carman ChingSamantha Strummer Canipe, PA-C   Thank you for involving me in this patient's care, I will continue to follow along. Carman ChingSamantha Shalia Bartko

## 2019-06-06 NOTE — ED Notes (Addendum)
ED TO INPATIENT HANDOFF REPORT  ED Nurse Name and Phone #: Berline Lopes [phone]  S Name/Age/Gender Michael [name] 23 y.o. male Room/Bed: ED08A/ED08A  Code Status   Code Status: Not on file  Home/SNF/Other Home Patient oriented to: self, place, time and situation Is this baseline? Yes   Triage Complete: Triage complete  Chief Complaint [facility] - Abdominal pain  Triage Note Pt to ed via ems from home. Pt c/o right side flank and abd pain, pt states "it hurts all the way down in my balls." pt having no issues urinating. Pt states he has had trouble with bowel movements. Pt states pain is 10/10 after receiving fentanyl from ems, pt falling asleep during triage. VSS.    Allergies Allergies  Allergen Reactions  . Penicillins Other (See Comments)    Has patient had a PCN reaction causing immediate rash, facial/tongue/throat swelling, SOB or lightheadedness with hypotension: Unknown Has patient had a PCN reaction causing severe rash involving mucus membranes or skin necrosis: Unknown Has patient had a PCN reaction that required hospitalization: Unknown Has patient had a PCN reaction occurring within the last 10 years: Unknown If all of the above answers are "NO", then may proceed with Cephalosporin use.   . Tramadol Hcl     Level of Care/Admitting Diagnosis ED Disposition    ED Disposition Condition Comment   Admit  Hospital Area: [facility] [[id number]]  Level of Care: Med-Surg [16]  Covid Evaluation: Asymptomatic Screening Protocol (No Symptoms)  Diagnosis: Renal calculus [147829]  Admitting Physician: Arnaldo Natal [[id number]]  Attending Physician: Arnaldo Natal [[id number]]  Estimated length of stay: past midnight tomorrow  Certification:: I certify this patient will need inpatient services for at least 2 midnights  PT Class (Do Not Modify): Inpatient [101]  PT Acc Code (Do Not Modify): Private [1]       B Medical/Surgery History Past  Medical History:  Diagnosis Date  . ADHD (attention deficit hyperactivity disorder)   . Allergy   . Chronic headache   . GERD (gastroesophageal reflux disease)   . History of kidney stones   . PTSD (post-traumatic stress disorder)    Past Surgical History:  Procedure Laterality Date  . TONSILLECTOMY AND ADENOIDECTOMY       A IV Location/Drains/Wounds Patient Lines/Drains/Airways Status   Active Line/Drains/Airways    Name:   Placement date:   Placement time:   Site:   Days:   Peripheral IV 06/05/19 Left Antecubital   06/05/19    2350    Antecubital   1          Intake/Output Last 24 hours  Intake/Output Summary (Last 24 hours) at 06/06/2019 0549 Last data filed at 06/06/2019 0149 Gross per 24 hour  Intake 160 ml  Output -  Net 160 ml    Labs/Imaging Results for orders placed or performed during the hospital encounter of 06/05/19 (from the past 48 hour(s))  Lipase, blood     Status: None   Collection Time: 06/05/19 11:51 PM  Result Value Ref Range   Lipase 27 11 - 51 U/L    Comment: Performed at [facility], [address] [place], [place] [address]  Comprehensive metabolic panel     Status: Abnormal   Collection Time: 06/05/19 11:51 PM  Result Value Ref Range   Sodium 141 135 - 145 mmol/L   Potassium 3.1 (L) 3.5 - 5.1 mmol/L   Chloride 104 98 - 111 mmol/L   CO2 28  22 - 32 mmol/L   Glucose, Bld 109 (H) 70 - 99 mg/dL   BUN 14 6 - 20 mg/dL   Creatinine, Ser 0.91 0.61 - 1.24 mg/dL   Calcium 8.7 (L) 8.9 - 10.3 mg/dL   Total Protein 6.8 6.5 - 8.1 g/dL   Albumin 4.1 3.5 - 5.0 g/dL   AST 25 15 - 41 U/L   ALT 21 0 - 44 U/L   Alkaline Phosphatase 92 38 - 126 U/L   Total Bilirubin 0.9 0.3 - 1.2 mg/dL   GFR calc non Af Amer >60 >60 mL/min   GFR calc Af Amer >60 >60 mL/min   Anion gap 9 5 - 15    Comment: Performed at [facility], [address] [place], [place] [address]  CBC     Status: None   Collection Time: 06/05/19 11:51 PM  Result Value  Ref Range   WBC 9.5 4.0 - 10.5 K/uL   RBC 4.67 4.22 - 5.81 MIL/uL   Hemoglobin 14.8 13.0 - 17.0 g/dL   HCT 44.1 39.0 - 52.0 %   MCV 94.4 80.0 - 100.0 fL   MCH 31.7 26.0 - 34.0 pg   MCHC 33.6 30.0 - 36.0 g/dL   RDW 12.7 11.5 - 15.5 %   Platelets 164 150 - 400 K/uL   nRBC 0.0 0.0 - 0.2 %    Comment: Performed at [facility], [address] [place], [place] [address]  SARS Coronavirus 2 (CEPHEID - Performed in [place] hospital lab), Hosp Order     Status: None   Collection Time: 06/06/19  1:38 AM   Specimen: Nasopharyngeal Swab  Result Value Ref Range   SARS Coronavirus 2 NEGATIVE NEGATIVE    Comment: (NOTE) If result is NEGATIVE SARS-CoV-2 target nucleic acids are NOT DETECTED. The SARS-CoV-2 RNA is generally detectable in upper and lower  respiratory specimens during the acute phase of infection. The lowest  concentration of SARS-CoV-2 viral copies this assay can detect is 250  copies / mL. A negative result does not preclude SARS-CoV-2 infection  and should not be used as the sole basis for treatment or other  patient management decisions.  A negative result may occur with  improper specimen collection / handling, submission of specimen other  than nasopharyngeal swab, presence of viral mutation(s) within the  areas targeted by this assay, and inadequate number of viral copies  (<250 copies / mL). A negative result must be combined with clinical  observations, patient history, and epidemiological information. If result is POSITIVE SARS-CoV-2 target nucleic acids are DETECTED. The SARS-CoV-2 RNA is generally detectable in upper and lower  respiratory specimens dur ing the acute phase of infection.  Positive  results are indicative of active infection with SARS-CoV-2.  Clinical  correlation with patient history and other diagnostic information is  necessary to determine patient infection status.  Positive results do  not rule out bacterial infection or  co-infection with other viruses. If result is PRESUMPTIVE POSTIVE SARS-CoV-2 nucleic acids MAY BE PRESENT.   A presumptive positive result was obtained on the submitted specimen  and confirmed on repeat testing.  While 2019 novel coronavirus  (SARS-CoV-2) nucleic acids may be present in the submitted sample  additional confirmatory testing may be necessary for epidemiological  and / or clinical management purposes  to differentiate between  SARS-CoV-2 and other Sarbecovirus currently known to infect humans.  If clinically indicated additional testing with an alternate test  methodology [phone]) is advised.  The SARS-CoV-2 RNA is generally  detectable in upper and lower respiratory sp ecimens during the acute  phase of infection. The expected result is Negative. Fact Sheet for Patients:  BoilerBrush.com.cyhttps://www.fda.gov/media/136312/download Fact Sheet for Healthcare Providers: https://pope.com/https://www.fda.gov/media/136313/download This test is not yet approved or cleared by the [place] FDA and has been authorized for detection and/or diagnosis of SARS-CoV-2 by FDA under an Emergency Use Authorization (EUA).  This EUA will remain in effect (meaning this test can be used) for the duration of the COVID-19 declaration under Section 564(b)(1) of the Act, 21 U.S.C. section 360bbb-3(b)(1), unless the authorization is terminated or revoked sooner. Performed at [facility], [address] [place], [place] [address]    Ct Renal Soundra PilonStone Study  Result Date: 06/06/2019 CLINICAL DATA:  23 year old male with right flank pain radiating to the scrotum. EXAM: CT ABDOMEN AND PELVIS WITHOUT CONTRAST TECHNIQUE: Multidetector CT imaging of the abdomen and pelvis was performed following the standard protocol without IV contrast. COMPARISON:  CT Abdomen and Pelvis 12/08/2017. FINDINGS: Lower chest: Lower lung volumes with mild lower lobe atelectasis. Hepatobiliary: Negative noncontrast liver and gallbladder. Pancreas:  Negative. Spleen: Negative. Adrenals/Urinary Tract: Normal adrenal glands. Negative noncontrast left kidney. Right nephro megaly and hydronephrosis with evidence of renal edema. Right hydroureter, which continues into the pelvis where an obstructing calculus in the distal ureter measures up to 11 millimeters (coronal image 61 and series 2, image 81). This is located about 1 centimeter proximal to the ureterovesical junction. Diminutive and unremarkable urinary bladder. Stomach/Bowel: Negative large bowel. Normal appendix on series 2, image 61. Negative terminal ileum. No dilated small bowel. Decompressed stomach. No free air or free fluid. Vascular/Lymphatic: Vascular patency is not evaluated in the absence of IV contrast. No lymphadenopathy. Reproductive: Negative. Other: No pelvic free fluid. Musculoskeletal: No acute osseous abnormality identified. IMPRESSION: 1. Acute obstructive uropathy on the right due to a large 11 mm calculus in the distal right ureter about 1 cm proximal to the UVJ. 2. No other urinary calculus identified. Normal appendix. Electronically Signed   By: Odessa FlemingH  Hall M.D.   On: 06/06/2019 00:45    Pending Labs Unresulted Labs (From admission, onward)    Start     Ordered   06/05/19 2351  Urinalysis, Complete w Microscopic  ONCE - STAT,   STAT     06/05/19 2350   Signed and Held  TSH  Add-on,   R     Signed and Held          Vitals/Pain Today's Vitals   06/05/19 2343 06/05/19 2344 06/06/19 0000  BP:  140/77 (!) 146/90  Pulse:  60 61  Resp:  11 (!) 25  Temp:  98.3 F (36.8 C)   TempSrc:  Oral   SpO2:  93% 95%  Weight: 75.8 kg    Height: 5\' 11"  (1.803 m)    PainSc: 10-Worst pain ever      Isolation Precautions No active isolations  Medications Medications  morphine 4 MG/ML injection 4 mg (4 mg Intravenous Given 06/06/19 0022)  ondansetron (ZOFRAN) injection 4 mg (4 mg Intravenous Given 06/06/19 0020)  lidocaine (XYLOCAINE) 114 mg in sodium chloride 0.9 % 100 mL IVPB  (0 mg/kg  75.8 kg Intravenous Stopped 06/06/19 0149)  HYDROmorphone (DILAUDID) injection 1 mg (1 mg Intravenous Given 06/06/19 0210)  sodium chloride 0.9 % bolus 1,000 mL (1,000 mLs Intravenous New Bag/Given 06/06/19 0329)  ketorolac (TORADOL) 30 MG/ML injection 30 mg (30 mg Intravenous Given 06/06/19 0326)  Mobility walks Low fall risk   Focused Assessments renal   R Recommendations: See Admitting Provider Note  Report given to:   Additional Notes: obstructing 11mm stone

## 2019-06-06 NOTE — ED Notes (Signed)
ED Provider at bedside. 

## 2019-06-07 ENCOUNTER — Ambulatory Visit
Admission: RE | Admit: 2019-06-07 | Discharge: 2019-06-07 | Disposition: A | Discharge status: Home / Self Care | Payer: Self-pay | Attending: Urology | Admitting: Urology

## 2019-06-07 ENCOUNTER — Encounter
Admission: RE | Disposition: A | Discharge status: Home / Self Care | Payer: Self-pay | Source: Home / Self Care | Attending: Urology

## 2019-06-07 ENCOUNTER — Encounter: Payer: Self-pay | Admitting: *Deleted

## 2019-06-07 ENCOUNTER — Ambulatory Visit: Payer: Self-pay

## 2019-06-07 ENCOUNTER — Other Ambulatory Visit: Payer: Self-pay

## 2019-06-07 DIAGNOSIS — F909 Attention-deficit hyperactivity disorder, unspecified type: Secondary | ICD-10-CM | POA: Insufficient documentation

## 2019-06-07 DIAGNOSIS — N201 Calculus of ureter: Secondary | ICD-10-CM

## 2019-06-07 DIAGNOSIS — Z1159 Encounter for screening for other viral diseases: Secondary | ICD-10-CM | POA: Insufficient documentation

## 2019-06-07 DIAGNOSIS — F4310 Post-traumatic stress disorder, unspecified: Secondary | ICD-10-CM | POA: Insufficient documentation

## 2019-06-07 DIAGNOSIS — Z87442 Personal history of urinary calculi: Secondary | ICD-10-CM | POA: Insufficient documentation

## 2019-06-07 DIAGNOSIS — K219 Gastro-esophageal reflux disease without esophagitis: Secondary | ICD-10-CM | POA: Insufficient documentation

## 2019-06-07 DIAGNOSIS — F17290 Nicotine dependence, other tobacco product, uncomplicated: Secondary | ICD-10-CM | POA: Insufficient documentation

## 2019-06-07 DIAGNOSIS — R51 Headache: Secondary | ICD-10-CM | POA: Insufficient documentation

## 2019-06-07 DIAGNOSIS — N132 Hydronephrosis with renal and ureteral calculous obstruction: Secondary | ICD-10-CM | POA: Insufficient documentation

## 2019-06-07 HISTORY — PX: EXTRACORPOREAL SHOCK WAVE LITHOTRIPSY: SHX1557

## 2019-06-07 LAB — URINE DRUG SCREEN, QUALITATIVE (ARMC ONLY)
Amphetamines, Ur Screen: NOT DETECTED
Barbiturates, Ur Screen: NOT DETECTED
Benzodiazepine, Ur Scrn: NOT DETECTED
Cannabinoid 50 Ng, Ur ~~LOC~~: POSITIVE — AB
Cocaine Metabolite,Ur ~~LOC~~: NOT DETECTED
MDMA (Ecstasy)Ur Screen: NOT DETECTED
Methadone Scn, Ur: NOT DETECTED
Opiate, Ur Screen: POSITIVE — AB
Phencyclidine (PCP) Ur S: NOT DETECTED
Tricyclic, Ur Screen: NOT DETECTED

## 2019-06-07 SURGERY — LITHOTRIPSY, ESWL
Anesthesia: Moderate Sedation | Laterality: Right

## 2019-06-07 MED ORDER — SODIUM CHLORIDE 0.9 % IV SOLN
INTRAVENOUS | Status: DC
  Administered 2019-06-07: 09:00:00 via INTRAVENOUS

## 2019-06-07 MED ORDER — DIAZEPAM 5 MG PO TABS
10.0000 mg | ORAL_TABLET | ORAL | Status: AC
  Administered 2019-06-07: 10 mg via ORAL

## 2019-06-07 MED ORDER — CIPROFLOXACIN HCL 500 MG PO TABS
500.0000 mg | ORAL_TABLET | ORAL | Status: AC
  Administered 2019-06-07: 500 mg via ORAL

## 2019-06-07 MED ORDER — DIPHENHYDRAMINE HCL 25 MG PO CAPS
ORAL_CAPSULE | ORAL | Status: AC
  Administered 2019-06-07: 25 mg via ORAL
  Filled 2019-06-07: qty 1

## 2019-06-07 MED ORDER — DOCUSATE SODIUM 100 MG PO CAPS: 100.0000 mg | ORAL_CAPSULE | Freq: Two times a day (BID) | ORAL | 0 refills | Status: DC

## 2019-06-07 MED ORDER — CIPROFLOXACIN HCL 500 MG PO TABS
ORAL_TABLET | ORAL | Status: AC
  Administered 2019-06-07: 500 mg via ORAL
  Filled 2019-06-07: qty 1

## 2019-06-07 MED ORDER — ONDANSETRON HCL 4 MG/2ML IJ SOLN
4.0000 mg | Freq: Once | INTRAMUSCULAR | Status: AC | PRN
  Administered 2019-06-07: 4 mg via INTRAVENOUS

## 2019-06-07 MED ORDER — DIAZEPAM 5 MG PO TABS
ORAL_TABLET | ORAL | Status: AC
  Administered 2019-06-07: 10 mg via ORAL
  Filled 2019-06-07: qty 2

## 2019-06-07 MED ORDER — DIPHENHYDRAMINE HCL 25 MG PO CAPS
25.0000 mg | ORAL_CAPSULE | ORAL | Status: AC
  Administered 2019-06-07: 25 mg via ORAL

## 2019-06-07 MED ORDER — TAMSULOSIN HCL 0.4 MG PO CAPS: 0.4000 mg | ORAL_CAPSULE | Freq: Every day | ORAL | 0 refills | Status: DC

## 2019-06-07 MED ORDER — ONDANSETRON HCL 2 MG/ML IV SOLN: 4.0000 mg | Freq: Once | INTRAVENOUS | Status: DC | PRN

## 2019-06-07 MED ORDER — ONDANSETRON HCL 4 MG/2ML IJ SOLN
INTRAMUSCULAR | Status: AC
  Administered 2019-06-07: 4 mg via INTRAVENOUS
  Filled 2019-06-07: qty 2

## 2019-06-07 MED ORDER — HYDROCODONE-ACETAMINOPHEN 5-325 MG PO TABS: 1.0000 | ORAL_TABLET | Freq: Four times a day (QID) | ORAL | 0 refills | Status: DC | PRN

## 2019-06-07 NOTE — Discharge Summary (Signed)
[employer] - [place] at [facility]   PATIENT NAME: Michael AlleyRahyme [name]    MR#:  [id number]  DATE OF BIRTH:  06/23/1996  DATE OF ADMISSION:  06/05/2019 ADMITTING PHYSICIAN: Arnaldo NatalMichael S Diamond, MD  DATE OF DISCHARGE: 06/06/2019  1:18 PM  PRIMARY CARE PHYSICIAN: Anola Gurneyhauvin, Robert, PA   ADMISSION DIAGNOSIS:  Intractable pain [R52] Ureteral stone with hydronephrosis [N13.2]  DISCHARGE DIAGNOSIS:  Active Problems:   Renal calculus   SECONDARY DIAGNOSIS:   Past Medical History:  Diagnosis Date  . ADHD (attention deficit hyperactivity disorder)   . Allergy   . Chronic headache   . GERD (gastroesophageal reflux disease)   . History of kidney stones   . PTSD (post-traumatic stress disorder)      ADMITTING HISTORY  Chief Complaint: Abdominal pain HPI: The patient with past medical history of kidney stones presents to the emergency department complaining of pain in his abdomen that radiates into his back/flank and down into his scrotum.  The patient would not share details of his symptoms with me but he has not had any fever, nausea or vomiting.  CT of the patient's abdomen revealed large kidney stone at the right UPJ.  Urology was consulted who recommended admission for pain control and lithotripsy.  HOSPITAL COURSE:   *Renal calculus with 11 mm stone at right UVJ.  Patient was admitted for pain control.  Pain improved well.  Seen by urology team.  Advised discharge and follow-up for lithotripsy the next day as outpatient.  Urinalysis collected.  Hypokalemia present on admission was replaced.  Elevated blood pressure secondary to pain.  Normalized prior to discharge.  Patient discharged home in stable condition with prescription for a fleets enema as requested by urology team.  CONSULTS OBTAINED:    DRUG ALLERGIES:   Allergies  Allergen Reactions  . Penicillins Other (See Comments) and Anaphylaxis    Has patient had a PCN reaction causing immediate rash,  facial/tongue/throat swelling, SOB or lightheadedness with hypotension: Unknown Has patient had a PCN reaction causing severe rash involving mucus membranes or skin necrosis: Unknown Has patient had a PCN reaction that required hospitalization: Unknown Has patient had a PCN reaction occurring within the last 10 years: Unknown If all of the above answers are "NO", then may proceed with Cephalosporin use.   . Tramadol Hcl     DISCHARGE MEDICATIONS:   Allergies as of 06/06/2019      Reactions   Penicillins Other (See Comments), Anaphylaxis   Has patient had a PCN reaction causing immediate rash, facial/tongue/throat swelling, SOB or lightheadedness with hypotension: Unknown Has patient had a PCN reaction causing severe rash involving mucus membranes or skin necrosis: Unknown Has patient had a PCN reaction that required hospitalization: Unknown Has patient had a PCN reaction occurring within the last 10 years: Unknown If all of the above answers are "NO", then may proceed with Cephalosporin use.   Tramadol Hcl       Medication List    ASK your doctor about these medications   sodium phosphate 7-19 GM/118ML Enem Place 133 mLs (1 enema total) rectally once for 1 dose. Ask about: Should I take this medication?       Today   VITAL SIGNS:  Blood pressure 126/79, pulse (!) 56, temperature 98.2 F (36.8 C), temperature source Oral, resp. rate (!) 25, height 5\' 11"  (1.803 m), weight 83.1 kg, SpO2 98 %.  I/O:  No intake or output data in the 24 hours ending 06/07/19 1602  PHYSICAL  EXAMINATION:  Physical Exam  GENERAL:  23 y.o.-year-old patient lying in the bed with no acute distress.  LUNGS: Normal breath sounds bilaterally, no wheezing, rales,rhonchi or crepitation. No use of accessory muscles of respiration.  CARDIOVASCULAR: S1, S2 normal. No murmurs, rubs, or gallops.  ABDOMEN: Soft, non-tender, non-distended. Bowel sounds present. No organomegaly or mass.  NEUROLOGIC: Moves all  4 extremities. PSYCHIATRIC: The patient is alert and oriented x 3.  SKIN: No obvious rash, lesion, or ulcer.   DATA REVIEW:   CBC Recent Labs  Lab 06/05/19 2351  WBC 9.5  HGB 14.8  HCT 44.1  PLT 164    Chemistries  Recent Labs  Lab 06/05/19 2351  NA 141  K 3.1*  CL 104  CO2 28  GLUCOSE 109*  BUN 14  CREATININE 0.91  CALCIUM 8.7*  AST 25  ALT 21  ALKPHOS 92  BILITOT 0.9    Cardiac Enzymes No results for input(s): TROPONINI in the last 168 hours.  Microbiology Results  Results for orders placed or performed during the hospital encounter of 06/05/19  SARS Coronavirus 2 (CEPHEID - Performed in [place] hospital lab), Hosp Order     Status: None   Collection Time: 06/06/19  1:38 AM   Specimen: Nasopharyngeal Swab  Result Value Ref Range Status   SARS Coronavirus 2 NEGATIVE NEGATIVE Final    Comment: (NOTE) If result is NEGATIVE SARS-CoV-2 target nucleic acids are NOT DETECTED. The SARS-CoV-2 RNA is generally detectable in upper and lower  respiratory specimens during the acute phase of infection. The lowest  concentration of SARS-CoV-2 viral copies this assay can detect is 250  copies / mL. A negative result does not preclude SARS-CoV-2 infection  and should not be used as the sole basis for treatment or other  patient management decisions.  A negative result may occur with  improper specimen collection / handling, submission of specimen other  than nasopharyngeal swab, presence of viral mutation(s) within the  areas targeted by this assay, and inadequate number of viral copies  (<250 copies / mL). A negative result must be combined with clinical  observations, patient history, and epidemiological information. If result is POSITIVE SARS-CoV-2 target nucleic acids are DETECTED. The SARS-CoV-2 RNA is generally detectable in upper and lower  respiratory specimens dur ing the acute phase of infection.  Positive  results are indicative of active infection with  SARS-CoV-2.  Clinical  correlation with patient history and other diagnostic information is  necessary to determine patient infection status.  Positive results do  not rule out bacterial infection or co-infection with other viruses. If result is PRESUMPTIVE POSTIVE SARS-CoV-2 nucleic acids MAY BE PRESENT.   A presumptive positive result was obtained on the submitted specimen  and confirmed on repeat testing.  While 2019 novel coronavirus  (SARS-CoV-2) nucleic acids may be present in the submitted sample  additional confirmatory testing may be necessary for epidemiological  and / or clinical management purposes  to differentiate between  SARS-CoV-2 and other Sarbecovirus currently known to infect humans.  If clinically indicated additional testing with an alternate test  methodology [phone]) is advised. The SARS-CoV-2 RNA is generally  detectable in upper and lower respiratory sp ecimens during the acute  phase of infection. The expected result is Negative. Fact Sheet for Patients:  StrictlyIdeas.no Fact Sheet for Healthcare Providers: BankingDealers.co.za This test is not yet approved or cleared by the [place] FDA and has been authorized for detection and/or diagnosis of SARS-CoV-2 by FDA under  an Emergency Use Authorization (EUA).  This EUA will remain in effect (meaning this test can be used) for the duration of the COVID-19 declaration under Section 564(b)(1) of the Act, 21 U.S.C. section 360bbb-3(b)(1), unless the authorization is terminated or revoked sooner. Performed at [facility], [address] [place], [place] [address]     RADIOLOGY:  Cathleen CortiDg Abd 1 View  Result Date: 06/07/2019 CLINICAL DATA:  Study prior to lithotripsy.  Right ureteral stone. EXAM: ABDOMEN - 1 VIEW COMPARISON:  None. FINDINGS: There is a 9.8 mm stone in the distal right ureter which is unchanged since the recent CT scan from June 06, 2019. No  other abnormalities are identified. IMPRESSION: The 9.8 mm stone remains in the distal right ureter. No other changes. Electronically Signed   By: Gerome Samavid  Williams III M.D   On: 06/07/2019 09:14   Ct Renal Stone Study  Result Date: 06/06/2019 CLINICAL DATA:  23 year old male with right flank pain radiating to the scrotum. EXAM: CT ABDOMEN AND PELVIS WITHOUT CONTRAST TECHNIQUE: Multidetector CT imaging of the abdomen and pelvis was performed following the standard protocol without IV contrast. COMPARISON:  CT Abdomen and Pelvis 12/08/2017. FINDINGS: Lower chest: Lower lung volumes with mild lower lobe atelectasis. Hepatobiliary: Negative noncontrast liver and gallbladder. Pancreas: Negative. Spleen: Negative. Adrenals/Urinary Tract: Normal adrenal glands. Negative noncontrast left kidney. Right nephro megaly and hydronephrosis with evidence of renal edema. Right hydroureter, which continues into the pelvis where an obstructing calculus in the distal ureter measures up to 11 millimeters (coronal image 61 and series 2, image 81). This is located about 1 centimeter proximal to the ureterovesical junction. Diminutive and unremarkable urinary bladder. Stomach/Bowel: Negative large bowel. Normal appendix on series 2, image 61. Negative terminal ileum. No dilated small bowel. Decompressed stomach. No free air or free fluid. Vascular/Lymphatic: Vascular patency is not evaluated in the absence of IV contrast. No lymphadenopathy. Reproductive: Negative. Other: No pelvic free fluid. Musculoskeletal: No acute osseous abnormality identified. IMPRESSION: 1. Acute obstructive uropathy on the right due to a large 11 mm calculus in the distal right ureter about 1 cm proximal to the UVJ. 2. No other urinary calculus identified. Normal appendix. Electronically Signed   By: Odessa FlemingH  Hall M.D.   On: 06/06/2019 00:45    Follow up with PCP in 1 week.  Management plans discussed with the patient, family and they are in agreement.  CODE  STATUS:  Code Status History    Date Active Date Inactive Code Status Order ID Comments User Context   06/06/2019 0638 06/06/2019 1621 Full Code [id number]  Arnaldo Nataliamond, Michael S, MD Inpatient   Advance Care Planning Activity      TOTAL TIME TAKING CARE OF THIS PATIENT ON DAY OF DISCHARGE: more than 30 minutes.   Molinda BailiffSrikar R Lamere Lightner M.D on 06/07/2019 at 4:02 PM  Between 7am to 6pm - Pager - [phone]  After 6pm go to [email or web address] - password [email or web address] [facility]  [facility]  Office  [phone]  CC: Primary care physician; Anola Gurneyhauvin, Robert, PA  Note: This dictation was prepared with Dragon dictation along with smaller phrase technology. Any transcriptional errors that result from this process are unintentional.

## 2019-06-07 NOTE — Discharge Instructions (Signed)
See [facility] discharge instructions in chart.  AMBULATORY SURGERY  DISCHARGE INSTRUCTIONS   1) The drugs that you were given will stay in your system until tomorrow so for the next 24 hours you should not:  A) Drive an automobile B) Make any legal decisions C) Drink any alcoholic beverage   2) You may resume regular meals tomorrow.  Today it is better to start with liquids and gradually work up to solid foods.  You may eat anything you prefer, but it is better to start with liquids, then soup and crackers, and gradually work up to solid foods.   3) Please notify your doctor immediately if you have any unusual bleeding, trouble breathing, redness and pain at the surgery site, drainage, fever, or pain not relieved by medication.    4) Additional Instructions:        Please contact your physician with any problems or Same Day Surgery at [phone], Monday through Friday 6 am to 4 pm, or [place] [facility] [place] Main number at [phone].  

## 2019-06-08 ENCOUNTER — Encounter: Payer: Self-pay | Admitting: Urology

## 2019-06-08 NOTE — Interval H&P Note (Signed)
History and Physical Interval Note:  06/08/2019 9:59 AM  Michael [name]  has presented today for surgery, with the diagnosis of Right Ureteral stone.  The various methods of treatment have been discussed with the patient and family. After consideration of risks, benefits and other options for treatment, the patient has consented to  Procedure(s): EXTRACORPOREAL SHOCK WAVE LITHOTRIPSY (ESWL) (Right) as a surgical intervention.  The patient's history has been reviewed, patient examined, no change in status, stable for surgery.  I have reviewed the patient's chart and labs.  Questions were answered to the patient's satisfaction.     Hollice Espy

## 2019-06-21 ENCOUNTER — Ambulatory Visit: Payer: Medicaid Other | Admitting: Physician Assistant

## 2019-06-21 ENCOUNTER — Encounter: Payer: Self-pay | Admitting: Physician Assistant

## 2019-06-30 ENCOUNTER — Other Ambulatory Visit: Payer: Self-pay | Admitting: Urology

## 2019-07-03 ENCOUNTER — Ambulatory Visit: Payer: Self-pay | Admitting: Physician Assistant

## 2019-07-06 ENCOUNTER — Encounter: Payer: Self-pay | Admitting: Physician Assistant

## 2019-07-06 ENCOUNTER — Ambulatory Visit: Payer: Self-pay | Admitting: Physician Assistant

## 2019-07-15 ENCOUNTER — Emergency Department
Admission: EM | Admit: 2019-07-15 | Discharge: 2019-07-15 | Disposition: A | Discharge status: Home / Self Care | Payer: Self-pay | Attending: Emergency Medicine | Admitting: Emergency Medicine

## 2019-07-15 ENCOUNTER — Other Ambulatory Visit: Payer: Self-pay

## 2019-07-15 ENCOUNTER — Emergency Department: Payer: Self-pay

## 2019-07-15 ENCOUNTER — Encounter: Payer: Self-pay | Admitting: Emergency Medicine

## 2019-07-15 DIAGNOSIS — F17290 Nicotine dependence, other tobacco product, uncomplicated: Secondary | ICD-10-CM | POA: Insufficient documentation

## 2019-07-15 DIAGNOSIS — N200 Calculus of kidney: Secondary | ICD-10-CM | POA: Insufficient documentation

## 2019-07-15 LAB — URINALYSIS, COMPLETE (UACMP) WITH MICROSCOPIC
Bacteria, UA: NONE SEEN
Bilirubin Urine: NEGATIVE
Glucose, UA: NEGATIVE mg/dL
Ketones, ur: NEGATIVE mg/dL
Leukocytes,Ua: NEGATIVE
Nitrite: NEGATIVE
Protein, ur: NEGATIVE mg/dL
Specific Gravity, Urine: 1.025 (ref 1.005–1.030)
Squamous Epithelial / HPF: NONE SEEN (ref 0–5)
pH: 5 (ref 5.0–8.0)

## 2019-07-15 LAB — COMPREHENSIVE METABOLIC PANEL
ALT: 19 U/L (ref 0–44)
AST: 25 U/L (ref 15–41)
Albumin: 4.7 g/dL (ref 3.5–5.0)
Alkaline Phosphatase: 90 U/L (ref 38–126)
Anion gap: 9 (ref 5–15)
BUN: 20 mg/dL (ref 6–20)
CO2: 29 mmol/L (ref 22–32)
Calcium: 9.6 mg/dL (ref 8.9–10.3)
Chloride: 104 mmol/L (ref 98–111)
Creatinine, Ser: 0.98 mg/dL (ref 0.61–1.24)
GFR calc Af Amer: >60 mL/min (ref >60)
GFR calc non Af Amer: >60 mL/min (ref >60)
Glucose, Bld: 100 mg/dL — ABNORMAL HIGH (ref 70–99)
Potassium: 3.5 mmol/L (ref 3.5–5.1)
Sodium: 142 mmol/L (ref 135–145)
Total Bilirubin: 0.8 mg/dL (ref 0.3–1.2)
Total Protein: 7.6 g/dL (ref 6.5–8.1)

## 2019-07-15 LAB — CBC
HCT: 46.7 % (ref 39.0–52.0)
Hemoglobin: 16 g/dL (ref 13.0–17.0)
MCH: 31.6 pg (ref 26.0–34.0)
MCHC: 34.3 g/dL (ref 30.0–36.0)
MCV: 92.1 fL (ref 80.0–100.0)
Platelets: 163 10*3/uL (ref 150–400)
RBC: 5.07 MIL/uL (ref 4.22–5.81)
RDW: 11.9 % (ref 11.5–15.5)
WBC: 9.2 10*3/uL (ref 4.0–10.5)
nRBC: 0 % (ref 0.0–0.2)

## 2019-07-15 LAB — LIPASE, BLOOD: Lipase: 43 U/L (ref 11–51)

## 2019-07-15 MED ORDER — ONDANSETRON 4 MG PO TBDP: 4.0000 mg | ORAL_TABLET | Freq: Three times a day (TID) | ORAL | 0 refills | Status: AC | PRN

## 2019-07-15 MED ORDER — TAMSULOSIN HCL 0.4 MG PO CAPS: 0.4000 mg | ORAL_CAPSULE | Freq: Every day | ORAL | 0 refills | Status: DC

## 2019-07-15 MED ORDER — NAPROXEN 500 MG PO TABS: 500.0000 mg | ORAL_TABLET | Freq: Two times a day (BID) | ORAL | 2 refills | Status: AC

## 2019-07-15 MED ORDER — ONDANSETRON HCL 4 MG/2ML IJ SOLN
4.0000 mg | Freq: Once | INTRAMUSCULAR | Status: AC
  Administered 2019-07-15: 4 mg via INTRAVENOUS
  Filled 2019-07-15: qty 2

## 2019-07-15 MED ORDER — KETOROLAC TROMETHAMINE 30 MG/ML IJ SOLN
30.0000 mg | Freq: Once | INTRAMUSCULAR | Status: AC
  Administered 2019-07-15: 30 mg via INTRAVENOUS
  Filled 2019-07-15: qty 1

## 2019-07-15 MED ORDER — MORPHINE SULFATE (PF) 4 MG/ML IV SOLN
4.0000 mg | Freq: Once | INTRAVENOUS | Status: AC
  Administered 2019-07-15: 4 mg via INTRAVENOUS
  Filled 2019-07-15: qty 1

## 2019-07-15 MED ORDER — SODIUM CHLORIDE 0.9 % IV SOLN
1000.0000 mL | Freq: Once | INTRAVENOUS | Status: AC
  Administered 2019-07-15: 1000 mL via INTRAVENOUS

## 2019-07-15 NOTE — ED Provider Notes (Signed)
[facility] Emergency Department Provider Note   ____________________________________________    I have reviewed the triage vital signs and the nursing notes.   HISTORY  Chief Complaint Flank Pain     HPI Michael [name] is a 23 y.o. male who presents with complaints of right flank pain with radiation to his groin.  Patient reports history of kidney stone states that he had lithotripsy but continues to have pain.  Review of records demonstrates that he was admitted to the hospital for kidney stone in July.  He reports he has had mild soreness since but it became acutely worse in the last day.  He has been unable to control the pain with over-the-counter medications.  Denies fevers or chills.  No dysuria.  Positive nausea and vomiting  Past Medical History:  Diagnosis Date  . ADHD (attention deficit hyperactivity disorder)   . Allergy   . Chronic headache   . GERD (gastroesophageal reflux disease)   . History of kidney stones   . PTSD (post-traumatic stress disorder)     Patient Active Problem List   Diagnosis Date Noted  . Renal calculus 06/06/2019  . 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 Suicidal ideation 09/19/2017  . Cannabis abuse 09/19/2017  . Hx of tympanostomy tubes 09/17/2015  . History of ADHD 05/09/2015  . Acne 05/09/2015  . Esophageal reflux 05/09/2015  . Personal history of traumatic brain injury 05/09/2015  . 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 Posttraumatic stress disorder 05/09/2015  . Chromophytosis 05/09/2015    Past Surgical History:  Procedure Laterality Date  . EXTRACORPOREAL SHOCK WAVE LITHOTRIPSY Right 06/07/2019   Procedure: EXTRACORPOREAL SHOCK WAVE LITHOTRIPSY (ESWL);  Surgeon: Hollice Espy, MD;  Location: [facility] ORS;  Service: Urology;  Laterality: Right;  . TONSILLECTOMY AND ADENOIDECTOMY      Prior to Admission medications   Medication Sig Start  Date End Date Taking? Authorizing Provider  naproxen (NAPROSYN) 500 MG tablet Take 1 tablet (500 mg total) by mouth 2 (two) times daily with a meal. 07/15/19   Lavonia Drafts, MD  ondansetron (ZOFRAN ODT) 4 MG disintegrating tablet Take 1 tablet (4 mg total) by mouth every 8 (eight) hours as needed. 07/15/19   Lavonia Drafts, MD  tamsulosin (FLOMAX) 0.4 MG CAPS capsule Take 1 capsule (0.4 mg total) by mouth daily. 07/15/19   Lavonia Drafts, MD     Allergies Penicillins and Tramadol hcl  Family History  Problem Relation Age of Onset  . Depression Mother   . Hyperlipidemia Mother   . Arthritis Mother   . Mitral valve prolapse Mother   . Alcohol abuse Father   . Epilepsy Brother   . AAA (abdominal aortic aneurysm) Brother     Social History Social History   Tobacco Use  . Smoking status: Current Every Day Smoker    Types: Cigars  . Smokeless tobacco: Never Used  Substance Use Topics  . Alcohol use: No    Alcohol/week: 0.0 standard drinks  . Drug use: Yes    Types: Marijuana    Review of Systems  Constitutional: No fever/chills Eyes: No visual changes.  ENT: No sore throat. Cardiovascular: Denies chest pain. Respiratory: Denies shortness of breath. Gastrointestinal: As above Genitourinary: As above Musculoskeletal: Negative for back pain. Skin: Negative for rash. Neurological: Negative for headaches or weakness   ____________________________________________   PHYSICAL EXAM:  VITAL SIGNS: ED Triage Vitals  Enc Vitals Group     BP  07/15/19 0246 (!) 133/116     Pulse Rate 07/15/19 0246 65     Resp 07/15/19 0246 18     Temp --      Temp src --      SpO2 07/15/19 0246 100 %     Weight 07/15/19 0245 79.4 kg (175 lb)     Height 07/15/19 0245 1.803 m (5\' 11" )     Head Circumference --      Peak Flow --      Pain Score 07/15/19 0245 10     Pain Loc --      Pain Edu? --      Excl. in GC? --     Constitutional: Alert and oriented.  Eyes: Conjunctivae are normal.    Nose: No congestion/rhinnorhea. Mouth/Throat: Mucous membranes are moist.    Cardiovascular: Normal rate, regular rhythm. Grossly normal heart sounds.  Good peripheral circulation. Respiratory: Normal respiratory effort.  No retractions. Lungs CTAB. Gastrointestinal: Soft and nontender. No distention.  No CVA tenderness.  Musculoskeletal: No lower extremity tenderness nor edema.  Warm and well perfused Neurologic:  Normal speech and language. No gross focal neurologic deficits are appreciated.  Skin:  Skin is warm, dry and intact. No rash noted. Psychiatric: Mood and affect are normal. Speech and behavior are normal.  ____________________________________________   LABS (all labs ordered are listed, but only abnormal results are displayed)  Labs Reviewed  COMPREHENSIVE METABOLIC PANEL - Abnormal; Notable for the following components:      Result Value   Glucose, Bld 100 (*)    All other components within normal limits  CBC  LIPASE, BLOOD  URINALYSIS, COMPLETE (UACMP) WITH MICROSCOPIC   ____________________________________________  EKG  None ____________________________________________  RADIOLOGY  CT scan demonstrates a millimeter stone at the right UV junction ____________________________________________   PROCEDURES  Procedure(s) performed: No  Procedures   Critical Care performed: No ____________________________________________   INITIAL IMPRESSION / ASSESSMENT AND PLAN / ED COURSE  Pertinent labs & imaging results that were available during my care of the patient were reviewed by me and considered in my medical decision making (see chart for details).  Patient presents with right flank pain, nausea vomiting concerning for ureterolithiasis.  Will give IV Toradol, obtain labs, urine, CT renal stone study and reevaluate  Patient positive for 7 mm right UVJ stone.  Patient had significant provement with Toradol but still complains of 6 out of 10 pain, will  give IV morphine, he is not driving   ----------------------------------------- 5:04 AM on 07/15/2019 ----------------------------------------- Patient reports pain has resolved.  He is well-appearing, afebrile, unremarkable labs.  Will discharge with close follow-up with urology, return precautions discussed     ____________________________________________   FINAL CLINICAL IMPRESSION(S) / ED DIAGNOSES  Final diagnoses:  Kidney stone        Note:  This document was prepared using Dragon voice recognition software and may include unintentional dictation errors.   Jene EveryKinner, Vincen Bejar, MD 07/15/19 [phone]

## 2019-07-15 NOTE — ED Triage Notes (Signed)
Patient states that he is having right flank pain with nausea that started today. Patient states that he was recently diagnosed with kidney stones and had lithotripsy.

## 2019-07-17 ENCOUNTER — Encounter: Payer: Self-pay | Admitting: Urology

## 2019-07-17 ENCOUNTER — Ambulatory Visit
Admission: RE | Admit: 2019-07-17 | Discharge: 2019-07-17 | Disposition: A | Discharge status: Home / Self Care | Payer: Self-pay | Source: Ambulatory Visit | Attending: Urology | Admitting: Urology

## 2019-07-17 ENCOUNTER — Ambulatory Visit (INDEPENDENT_AMBULATORY_CARE_PROVIDER_SITE_OTHER): Payer: Self-pay | Admitting: Urology

## 2019-07-17 ENCOUNTER — Other Ambulatory Visit: Payer: Self-pay

## 2019-07-17 VITALS — BP 126/54 | HR 65 | Ht 71.0 in | Wt 173.0 lb

## 2019-07-17 DIAGNOSIS — N201 Calculus of ureter: Secondary | ICD-10-CM | POA: Insufficient documentation

## 2019-07-17 MED ORDER — TAMSULOSIN HCL 0.4 MG PO CAPS: 0.4000 mg | ORAL_CAPSULE | Freq: Every day | ORAL | 0 refills | Status: AC

## 2019-07-17 NOTE — Progress Notes (Signed)
07/17/2019 12:26 PM   Michael [name] 06-29-1996 [id number]  Referring provider: Anola Gurney, PA No address on file  Chief Complaint  Patient presents with  . Follow-up    HPI: 23 y.o. male presents for follow-up.  He underwent shockwave lithotripsy of an 11 mm right distal ureteral calculus on 06/07/2019.  He did not show for his office follow-up however presented to the ED on 07/15/2019 complaining of right flank pain radiating to the right groin region.  A stone protocol CT of the abdomen pelvis was performed which was felt to show a 7 mm fragment at the right UVJ.  He complains of mild right flank pain.  Denies fever, chills or gross hematuria.   PMH: Past Medical History:  Diagnosis Date  . ADHD (attention deficit hyperactivity disorder)   . Allergy   . Chronic headache   . GERD (gastroesophageal reflux disease)   . History of kidney stones   . PTSD (post-traumatic stress disorder)     Surgical History: Past Surgical History:  Procedure Laterality Date  . EXTRACORPOREAL SHOCK WAVE LITHOTRIPSY Right 06/07/2019   Procedure: EXTRACORPOREAL SHOCK WAVE LITHOTRIPSY (ESWL);  Surgeon: Vanna Scotland, MD;  Location: [facility] ORS;  Service: Urology;  Laterality: Right;  . TONSILLECTOMY AND ADENOIDECTOMY      Home Medications:  Allergies as of 07/17/2019      Reactions   Penicillins Other (See Comments), Anaphylaxis   Has patient had a PCN reaction causing immediate rash, facial/tongue/throat swelling, SOB or lightheadedness with hypotension: Unknown Has patient had a PCN reaction causing severe rash involving mucus membranes or skin necrosis: Unknown Has patient had a PCN reaction that required hospitalization: Unknown Has patient had a PCN reaction occurring within the last 10 years: Unknown If all of the above answers are "NO", then may proceed with Cephalosporin use.   Tramadol Hcl       Medication List       Accurate as of July 17, 2019 12:26 PM. If you have any  questions, ask your nurse or doctor.        naproxen 500 MG tablet Commonly known as: Naprosyn Take 1 tablet (500 mg total) by mouth 2 (two) times daily with a meal.   ondansetron 4 MG disintegrating tablet Commonly known as: Zofran ODT Take 1 tablet (4 mg total) by mouth every 8 (eight) hours as needed.   tamsulosin 0.4 MG Caps capsule Commonly known as: Flomax Take 1 capsule (0.4 mg total) by mouth daily.       Allergies:  Allergies  Allergen Reactions  . Penicillins Other (See Comments) and Anaphylaxis    Has patient had a PCN reaction causing immediate rash, facial/tongue/throat swelling, SOB or lightheadedness with hypotension: Unknown Has patient had a PCN reaction causing severe rash involving mucus membranes or skin necrosis: Unknown Has patient had a PCN reaction that required hospitalization: Unknown Has patient had a PCN reaction occurring within the last 10 years: Unknown If all of the above answers are "NO", then may proceed with Cephalosporin use.   . Tramadol Hcl     Family History: Family History  Problem Relation Age of Onset  . Depression Mother   . Hyperlipidemia Mother   . Arthritis Mother   . Mitral valve prolapse Mother   . Alcohol abuse Father   . Epilepsy Brother   . AAA (abdominal aortic aneurysm) Brother     Social History:  reports that he has been smoking cigars. He has never used smokeless tobacco. He  reports current drug use. Drug: Marijuana. He reports that he does not drink alcohol.  ROS: UROLOGY Frequent Urination?: No Hard to postpone urination?: No Burning/pain with urination?: No Get up at night to urinate?: No Leakage of urine?: No Urine stream starts and stops?: No Trouble starting stream?: No Do you have to strain to urinate?: No Blood in urine?: No Urinary tract infection?: No Sexually transmitted disease?: No Injury to kidneys or bladder?: Yes Painful intercourse?: No Weak stream?: No Erection problems?: No Penile  pain?: No  Gastrointestinal Nausea?: Yes Vomiting?: No Indigestion/heartburn?: No Diarrhea?: No Constipation?: No  Constitutional Fever: No Night sweats?: No Fatigue?: No  Skin Skin rash/lesions?: No Itching?: No  Eyes Blurred vision?: No Double vision?: No  Ears/Nose/Throat Sore throat?: No Sinus problems?: No  Hematologic/Lymphatic Swollen glands?: No Easy bruising?: No  Cardiovascular Leg swelling?: No Chest pain?: No  Respiratory Cough?: No Shortness of breath?: No  Endocrine Excessive thirst?: No  Musculoskeletal Back pain?: No Joint pain?: No  Neurological Headaches?: Yes Dizziness?: No  Psychologic Depression?: No Anxiety?: No  Physical Exam: BP (!) 126/54   Pulse 65   Ht 5\' 11"  (1.803 m)   Wt 173 lb (78.5 kg)   BMI 24.13 kg/m   Constitutional:  Alert and oriented, No acute distress. HEENT:  AT, moist mucus membranes.  Trachea midline, no masses. Cardiovascular: No clubbing, cyanosis, or edema. Respiratory: Normal respiratory effort, no increased work of breathing. GI: Abdomen is soft, nontender, nondistended, no abdominal masses   Pertinent Imaging: On my review of the CT and today's KUB the calculus is very medial and I think the stone is most likely in the bladder  Results for orders placed during the hospital encounter of 07/15/19  CT RENAL STONE STUDY   Narrative CLINICAL DATA:  Right flank pain. History of kidney stones and recent lithotripsy.  EXAM: CT ABDOMEN AND PELVIS WITHOUT CONTRAST  TECHNIQUE: Multidetector CT imaging of the abdomen and pelvis was performed following the standard protocol without IV contrast. Low-dose protocol utilized given age, weight, and recent exam, imaging obtained from just above the kidneys through the pelvis.  COMPARISON:  CT 06/06/2019  FINDINGS: Lower chest: Lung bases not included in the field of view.  Hepatobiliary: Upper aspect of the liver not included in the field of view.  No focal liver abnormality is seen on noncontrast exam. Gallbladder physiologically distended, no calcified stone. No biliary dilatation.  Pancreas: No ductal dilatation or inflammation.  Spleen: Upper spleen not included in the field of view. No focal abnormality where visualized  Adrenals/Urinary Tract: Normal adrenal glands. 7 mm stone at the right ureterovesicular junction with mild hydroureteronephrosis. Minimal right perinephric edema. The degree of hydronephrosis is diminished from prior exam.  Stomach/Bowel: Stomach not entirely included in field of view. Unremarkable lower visualized. Appendix appears normal. No evidence of bowel wall thickening, distention, or inflammatory changes.  Vascular/Lymphatic: Abdominal aorta is normal in caliber. No bulky abdominopelvic adenopathy.  Reproductive: Prostate is unremarkable.  Other: No free air, free fluid, or intra-abdominal fluid collection.  Musculoskeletal: There are no acute or suspicious osseous abnormalities.  IMPRESSION: Obstructing 7 mm stone at the right ureterovesicular junction with mild hydroureteronephrosis.   Electronically Signed   By: Keith Rake M.D.   On: 07/15/2019 03:37     Assessment & Plan:   On most recent CT of 8/23 and today's KUB the stone fragment appears to be in the bladder.  He was informed he should be able to pass.  If  he does not pass within the next 1 to 2 weeks will schedule cystoscopy with possible right ureteroscopy.  Tamsulosin was refilled.  Riki AltesScott C Yajahira Tison, MD  [facility] [address], [address] [place], [place] [address] [phone]

## 2019-07-22 ENCOUNTER — Encounter: Payer: Self-pay | Admitting: Emergency Medicine

## 2019-07-22 ENCOUNTER — Emergency Department: Payer: Self-pay

## 2019-07-22 ENCOUNTER — Other Ambulatory Visit: Payer: Self-pay

## 2019-07-22 ENCOUNTER — Emergency Department
Admission: EM | Admit: 2019-07-22 | Discharge: 2019-07-24 | Disposition: A | Discharge status: Home / Self Care | Payer: Self-pay | Attending: Emergency Medicine | Admitting: Emergency Medicine

## 2019-07-22 DIAGNOSIS — X500XXA Overexertion from strenuous movement or load, initial encounter: Secondary | ICD-10-CM | POA: Insufficient documentation

## 2019-07-22 DIAGNOSIS — Z046 Encounter for general psychiatric examination, requested by authority: Secondary | ICD-10-CM | POA: Insufficient documentation

## 2019-07-22 DIAGNOSIS — M79661 Pain in right lower leg: Secondary | ICD-10-CM | POA: Insufficient documentation

## 2019-07-22 DIAGNOSIS — F17290 Nicotine dependence, other tobacco product, uncomplicated: Secondary | ICD-10-CM | POA: Insufficient documentation

## 2019-07-22 DIAGNOSIS — F4310 Post-traumatic stress disorder, unspecified: Secondary | ICD-10-CM | POA: Diagnosis present

## 2019-07-22 DIAGNOSIS — Z20828 Contact with and (suspected) exposure to other viral communicable diseases: Secondary | ICD-10-CM | POA: Insufficient documentation

## 2019-07-22 DIAGNOSIS — R45851 Suicidal ideations: Secondary | ICD-10-CM | POA: Insufficient documentation

## 2019-07-22 DIAGNOSIS — F329 Major depressive disorder, single episode, unspecified: Secondary | ICD-10-CM | POA: Insufficient documentation

## 2019-07-22 DIAGNOSIS — Y9302 Activity, running: Secondary | ICD-10-CM | POA: Insufficient documentation

## 2019-07-22 LAB — SARS CORONAVIRUS 2 BY RT PCR (HOSPITAL ORDER, PERFORMED IN ~~LOC~~ HOSPITAL LAB): SARS Coronavirus 2: NEGATIVE

## 2019-07-22 LAB — CBC WITH DIFFERENTIAL/PLATELET
Abs Immature Granulocytes: 0.01 10*3/uL (ref 0.00–0.07)
Basophils Absolute: 0 10*3/uL (ref 0.0–0.1)
Basophils Relative: 1 %
Eosinophils Absolute: 0 10*3/uL (ref 0.0–0.5)
Eosinophils Relative: 1 %
HCT: 45.5 % (ref 39.0–52.0)
Hemoglobin: 15.9 g/dL (ref 13.0–17.0)
Immature Granulocytes: 0 %
Lymphocytes Relative: 35 %
Lymphs Abs: 2.1 10*3/uL (ref 0.7–4.0)
MCH: 31.5 pg (ref 26.0–34.0)
MCHC: 34.9 g/dL (ref 30.0–36.0)
MCV: 90.3 fL (ref 80.0–100.0)
Monocytes Absolute: 0.5 10*3/uL (ref 0.1–1.0)
Monocytes Relative: 8 %
Neutro Abs: 3.4 10*3/uL (ref 1.7–7.7)
Neutrophils Relative %: 55 %
Platelets: 150 10*3/uL (ref 150–400)
RBC: 5.04 MIL/uL (ref 4.22–5.81)
RDW: 11.8 % (ref 11.5–15.5)
WBC: 6.1 10*3/uL (ref 4.0–10.5)
nRBC: 0 % (ref 0.0–0.2)

## 2019-07-22 LAB — COMPREHENSIVE METABOLIC PANEL
ALT: 18 U/L (ref 0–44)
AST: 27 U/L (ref 15–41)
Albumin: 4.3 g/dL (ref 3.5–5.0)
Alkaline Phosphatase: 75 U/L (ref 38–126)
Anion gap: 12 (ref 5–15)
BUN: 16 mg/dL (ref 6–20)
CO2: 19 mmol/L — ABNORMAL LOW (ref 22–32)
Calcium: 9.1 mg/dL (ref 8.9–10.3)
Chloride: 110 mmol/L (ref 98–111)
Creatinine, Ser: 1.05 mg/dL (ref 0.61–1.24)
GFR calc Af Amer: >60 mL/min (ref >60)
GFR calc non Af Amer: >60 mL/min (ref >60)
Glucose, Bld: 103 mg/dL — ABNORMAL HIGH (ref 70–99)
Potassium: 3.6 mmol/L (ref 3.5–5.1)
Sodium: 141 mmol/L (ref 135–145)
Total Bilirubin: 0.8 mg/dL (ref 0.3–1.2)
Total Protein: 6.7 g/dL (ref 6.5–8.1)

## 2019-07-22 LAB — URINALYSIS, COMPLETE (UACMP) WITH MICROSCOPIC
Bacteria, UA: NONE SEEN
Bilirubin Urine: NEGATIVE
Glucose, UA: NEGATIVE mg/dL
Hgb urine dipstick: NEGATIVE
Ketones, ur: 5 mg/dL — AB
Leukocytes,Ua: NEGATIVE
Nitrite: NEGATIVE
Protein, ur: 30 mg/dL — AB
Specific Gravity, Urine: 1.017 (ref 1.005–1.030)
Squamous Epithelial / HPF: NONE SEEN (ref 0–5)
pH: 6 (ref 5.0–8.0)

## 2019-07-22 LAB — URINE DRUG SCREEN, QUALITATIVE (ARMC ONLY)
Amphetamines, Ur Screen: NOT DETECTED
Barbiturates, Ur Screen: NOT DETECTED
Benzodiazepine, Ur Scrn: NOT DETECTED
Cannabinoid 50 Ng, Ur ~~LOC~~: POSITIVE — AB
Cocaine Metabolite,Ur ~~LOC~~: NOT DETECTED
MDMA (Ecstasy)Ur Screen: NOT DETECTED
Methadone Scn, Ur: NOT DETECTED
Opiate, Ur Screen: NOT DETECTED
Phencyclidine (PCP) Ur S: NOT DETECTED
Tricyclic, Ur Screen: NOT DETECTED

## 2019-07-22 LAB — ACETAMINOPHEN LEVEL: Acetaminophen (Tylenol), Serum: <10 ug/mL — ABNORMAL LOW (ref 10–30)

## 2019-07-22 LAB — ETHANOL: Alcohol, Ethyl (B): <10 mg/dL

## 2019-07-22 LAB — SALICYLATE LEVEL: Salicylate Lvl: <7 mg/dL (ref 2.8–30.0)

## 2019-07-22 MED ORDER — QUETIAPINE FUMARATE 25 MG PO TABS
50.0000 mg | ORAL_TABLET | Freq: Every day | ORAL | Status: DC
  Administered 2019-07-22 – 2019-07-23 (×2): 50 mg via ORAL
  Filled 2019-07-22 (×2): qty 2

## 2019-07-22 MED ORDER — IBUPROFEN 600 MG PO TABS
600.0000 mg | ORAL_TABLET | Freq: Once | ORAL | Status: AC
  Administered 2019-07-22: 19:00:00 600 mg via ORAL
  Filled 2019-07-22: qty 1

## 2019-07-22 NOTE — ED Triage Notes (Signed)
Pt presents IVC via [facility] with [employer]. Pt was reported suicidal by his brother, and when [employer] arrived,he ran. Pt has pain in his right calf. Denies SI/HI; states this stems from an argument with his mother and brother. Pt assured that we will do our best to sort this out and that we will keep him safe in the meantime.Pt alert and oriented, NAD noted.

## 2019-07-22 NOTE — ED Notes (Signed)
Hourly rounding reveals patient in room. No complaints, stable, in no acute distress. Q15 minute rounds and monitoring via Security Cameras to continue. 
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
## 2019-07-22 NOTE — ED Notes (Signed)
Pt in shower.  
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
## 2019-07-22 NOTE — ED Notes (Addendum)
Pt belongings: - pants - belt - socks - shirt - 3 gold colored necklaces - 1 set of ear rings - shoes - wallet - 1 cigar
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
## 2019-07-22 NOTE — ED Provider Notes (Addendum)
[facility] Emergency Department Provider Note  ____________________________________________   I have reviewed the triage vital signs and the nursing notes. Where available I have reviewed prior notes and, if possible and indicated, outside hospital notes.   Patient seen and evaluated during the coronavirus epidemic during a time with low staffing  Patient seen for the symptoms described in the history of present illness. She was evaluated in the context of the global COVID-19 pandemic, which necessitated consideration that the patient might be at risk for infection with the SARS-CoV-2 virus that causes COVID-19. Institutional protocols and algorithms that pertain to the evaluation of patients at risk for COVID-19 are in a state of rapid change based on information released by regulatory bodies including the CDC and federal and state organizations. These policies and algorithms were followed during the patient's care in the ED.    HISTORY  Chief Complaint Psychiatric Evaluation    HPI Michael [name] is a 23 y.o. male  Who is here under IVC by his family for allegedly making suicidal comments.  He denies being suicidal.  He states that when the police came to his house to serve him with papers for that he ran because he was worried about why the police were there.  In the active running he pulled his calf muscle which is feeling better now  He denies taking any overdose or trying to harm anyone he has no SI or HI he states   Past Medical History:  Diagnosis Date  . ADHD (attention deficit hyperactivity disorder)   . Allergy   . Chronic headache   . GERD (gastroesophageal reflux disease)   . History of kidney stones   . PTSD (post-traumatic stress disorder)     Patient Active Problem List   Diagnosis Date Noted  . Renal calculus 06/06/2019  . 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 Suicidal ideation 09/19/2017  .  Cannabis abuse 09/19/2017  . Hx of tympanostomy tubes 09/17/2015  . History of ADHD 05/09/2015  . Acne 05/09/2015  . Esophageal reflux 05/09/2015  . Personal history of traumatic brain injury 05/09/2015  . 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 PTSD (post-traumatic stress disorder) 05/09/2015  . Chromophytosis 05/09/2015    Past Surgical History:  Procedure Laterality Date  . EXTRACORPOREAL SHOCK WAVE LITHOTRIPSY Right 06/07/2019   Procedure: EXTRACORPOREAL SHOCK WAVE LITHOTRIPSY (ESWL);  Surgeon: Vanna Scotland, MD;  Location: [facility] ORS;  Service: Urology;  Laterality: Right;  . TONSILLECTOMY AND ADENOIDECTOMY      Prior to Admission medications   Medication Sig Start Date End Date Taking? Authorizing Provider  naproxen (NAPROSYN) 500 MG tablet Take 1 tablet (500 mg total) by mouth 2 (two) times daily with a meal. 07/15/19   Jene Every, MD  ondansetron (ZOFRAN ODT) 4 MG disintegrating tablet Take 1 tablet (4 mg total) by mouth every 8 (eight) hours as needed. 07/15/19   Jene Every, MD  tamsulosin (FLOMAX) 0.4 MG CAPS capsule Take 1 capsule (0.4 mg total) by mouth daily. 07/17/19   Riki Altes, MD    Allergies Penicillins and Tramadol hcl  Family History  Problem Relation Age of Onset  . Depression Mother   . Hyperlipidemia Mother   . Arthritis Mother   . Mitral valve prolapse Mother   . Alcohol abuse Father   . Epilepsy Brother   . AAA (abdominal aortic aneurysm) Brother     Social History Social History   Tobacco Use  .  Smoking status: Current Every Day Smoker    Types: Cigars  . Smokeless tobacco: Never Used  Substance Use Topics  . Alcohol use: No    Alcohol/week: 0.0 standard drinks  . Drug use: Yes    Types: Marijuana    Review of Systems Constitutional: No fever/chills Eyes: No visual changes. ENT: No sore throat. No stiff neck no neck pain Cardiovascular: Denies chest pain. Respiratory: Denies shortness of  breath. Gastrointestinal:   no vomiting.  No diarrhea.  No constipation. Genitourinary: Negative for dysuria. Musculoskeletal: Negative lower extremity swelling Skin: Negative for rash. Neurological: Negative for severe headaches, focal weakness or numbness.   ____________________________________________   PHYSICAL EXAM:  VITAL SIGNS: ED Triage Vitals  Enc Vitals Group     BP 07/22/19 1704 (!) 146/56     Pulse Rate 07/22/19 1704 (!) 102     Resp 07/22/19 1704 20     Temp 07/22/19 1704 98.5 F (36.9 C)     Temp Source 07/22/19 1704 Oral     SpO2 07/22/19 1704 99 %     Weight 07/22/19 1706 173 lb 1 oz (78.5 kg)     Height 07/22/19 1706 5\' 7"  (1.702 m)     Head Circumference --      Peak Flow --      Pain Score 07/22/19 1705 7     Pain Loc --      Pain Edu? --      Excl. in GC? --     Constitutional: Alert and oriented. Well appearing and in no acute distress. Eyes: Conjunctivae are normal Head: Atraumatic HEENT: No congestion/rhinnorhea. Mucous membranes are moist.  Oropharynx non-erythematous Neck:   Nontender with no meningismus, no masses, no stridor Cardiovascular: Normal rate, regular rhythm. Grossly normal heart sounds.  Good peripheral circulation. Respiratory: Normal respiratory effort.  No retractions. Lungs CTAB. Abdominal: Soft and nontender. No distention. No guarding no rebound Back:  There is no focal tenderness or step off.  there is no midline tenderness there are no lesions noted. there is no CVA tenderness Musculoskeletal: Has minimal tenderness of the gastrocnemius muscle, however can flex and extend the ankle with no difficulty, there is strong distal pulses compartments are soft, there is no evidence of compartment syndrome, he has excellent sensation full range of motion of the ankle and knee with no discomfort.  He has minimal discomfort to palpation of the gastroc region.  His Achilles tendon is intact and nontender no upper extremity tenderness. No  joint effusions, no DVT signs strong distal pulses no edema Neurologic:  Normal speech and language. No gross focal neurologic deficits are appreciated.  Skin:  Skin is warm, dry and intact. No rash noted. Psychiatric: Mood and affect are normal. Speech and behavior are normal.  ____________________________________________   LABS (all labs ordered are listed, but only abnormal results are displayed)  Labs Reviewed  COMPREHENSIVE METABOLIC PANEL - Abnormal; Notable for the following components:      Result Value   CO2 19 (*)    Glucose, Bld 103 (*)    All other components within normal limits  ACETAMINOPHEN LEVEL - Abnormal; Notable for the following components:   Acetaminophen (Tylenol), Serum <10 (*)    All other components within normal limits  URINALYSIS, COMPLETE (UACMP) WITH MICROSCOPIC - Abnormal; Notable for the following components:   Color, Urine YELLOW (*)    APPearance CLEAR (*)    Ketones, ur 5 (*)    Protein, ur 30 (*)  All other components within normal limits  URINE DRUG SCREEN, QUALITATIVE ([facility] ONLY) - Abnormal; Notable for the following components:   Cannabinoid 50 Ng, Ur [place] POSITIVE (*)    All other components within normal limits  SARS CORONAVIRUS 2 (HOSPITAL ORDER, [place] [facility])  CBC WITH DIFFERENTIAL/PLATELET  ETHANOL  SALICYLATE LEVEL    Pertinent labs  results that were available during my care of the patient were reviewed by me and considered in my medical decision making (see chart for details). ____________________________________________  EKG  I personally interpreted any EKGs ordered by me or triage  ____________________________________________  RADIOLOGY  Pertinent labs & imaging results that were available during my care of the patient were reviewed by me and considered in my medical decision making (see chart for details). If possible, patient and/or family made aware of any abnormal findings.  Dg Tibia/fibula  Right  Result Date: 07/22/2019 CLINICAL DATA:  Pain in distal right calf. EXAM: RIGHT TIBIA AND FIBULA - 2 VIEW COMPARISON:  None. FINDINGS: There is no evidence of fracture or other focal bone lesions. Soft tissues are unremarkable. IMPRESSION: Negative. Electronically Signed   By: Dorise Bullion III M.D   On: 07/22/2019 18:07   ____________________________________________    PROCEDURES  Procedure(s) performed: None  Procedures  Critical Care performed: None  ____________________________________________   INITIAL IMPRESSION / ASSESSMENT AND PLAN / ED COURSE  Pertinent labs & imaging results that were available during my care of the patient were reviewed by me and considered in my medical decision making (see chart for details).   Patient here under IVC he will be evaluated by psychiatry, he is medically cleared for my point of view.  He did have some tenderness to his calf muscle after sprinting away from the police but there is no evidence of significant injury or rupture is not bruised, at this time the pain after Motrin is gone.  I doubt that he requires splinting.  I did instruct him that after his psychiatric issues had cleared up he should follow closely with orthopedic surgery if it continues to bother him.  X-ray shows no evidence of broken bone and there is no evidence of compartment syndrome or Achilles tendon rupture or DVT etc.  ----------------------------------------- 11:37 PM on 07/22/2019 -----------------------------------------  Signed out to dr. Beather Arbour at the end of my shift.    ____________________________________________   FINAL CLINICAL IMPRESSION(S) / ED DIAGNOSES  Final diagnoses:  None      This chart was dictated using voice recognition software.  Despite best efforts to proofread,  errors can occur which can change meaning.      Schuyler Amor, MD 07/22/19 2043    Schuyler Amor, MD 07/22/19 [phone]

## 2019-07-22 NOTE — ED Notes (Signed)
Pt. Transferred to BHU from ED to room 4 after screening for contraband. Report to include Situation, Background, Assessment and Recommendations from RN. Pt. Oriented to unit including Q15 minute rounds as well as the security cameras for their protection. Patient is alert and oriented, warm and dry in no acute distress. Patient denies SI, HI, and AVH. Pt. Encouraged to let me know if needs arise.  

## 2019-07-22 NOTE — ED Notes (Addendum)
Pt seen hopping around in room and in the hallway, pt finished meal tray, pt appears relaxed sitting on bed with tv on

## 2019-07-23 NOTE — ED Notes (Signed)
BEHAVIORAL HEALTH ROUNDING Patient sleeping: No. Patient alert and oriented: yes Behavior appropriate: Yes.  ; If no, describe:  Nutrition and fluids offered: yes Toileting and hygiene offered: Yes  Sitter present: q15 minute observations and security camera monitoring   
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
## 2019-07-23 NOTE — ED Notes (Signed)
Hourly rounding reveals patient sleeping in room. No complaints, stable, in no acute distress. Q15 minute rounds and monitoring via Security Cameras to continue. 

## 2019-07-23 NOTE — ED Notes (Signed)
Report on Situation, Background, Assessment, and Recommendations received from Amy, RN. Patient alert and in no visible distress. Patient made aware of Q15 minute rounds and security cameras for their safety. Patient instructed to come to me with needs or concerns.

## 2019-07-23 NOTE — ED Notes (Signed)
Pt given meal tray.

## 2019-07-23 NOTE — ED Notes (Signed)
Referral information for Psychiatric Hospitalization faxed to;   . [facility] ([phone]-or- [phone]),   . [facility] [phone] -or- [phone]) [phone]  . [facility] ([phone]),   . [facility] ([phone] -or- [phone]),   . [facility] ([phone] or [phone])  . [facility] ([phone])  

## 2019-07-23 NOTE — ED Notes (Signed)
Patient refused snack but requested juice

## 2019-07-23 NOTE — ED Provider Notes (Signed)
-----------------------------------------   6:13 AM on 07/23/2019 -----------------------------------------   Blood pressure 115/75, pulse 71, temperature 97.8 F (36.6 C), temperature source Oral, resp. rate 18, height 5\' 7"  (1.702 m), weight 78.5 kg, SpO2 100 %.  The patient is calm and cooperative at this time.  There have been no acute events since the last update.  Awaiting disposition plan from Behavioral Medicine and/or Social Work team(s).   Paulette Blanch, MD 07/23/19 [phone]

## 2019-07-23 NOTE — ED Notes (Signed)
ED BHU PLACEMENT JUSTIFICATION Is the patient under IVC or is there intent for IVC: Yes.   Is the patient medically cleared: Yes.   Is there vacancy in the ED BHU: Yes.   Is the population mix appropriate for patient: Yes.   Is the patient awaiting placement in inpatient or outpatient setting: Yes.   Has the patient had a psychiatric consult: Yes.   Survey of unit performed for contraband, proper placement and condition of furniture, tampering with fixtures in bathroom, shower, and each patient room: Yes.  ; Findings:  APPEARANCE/BEHAVIOR Calm and cooperative NEURO ASSESSMENT Orientation: oriented x3  Denies pain Hallucinations: No.None noted (Hallucinations)  Denies at this time  Speech: Normal Gait: normal RESPIRATORY ASSESSMENT Even  Unlabored respirations  CARDIOVASCULAR ASSESSMENT Pulses equal   regular rate  Skin warm and dry   GASTROINTESTINAL ASSESSMENT no GI complaint EXTREMITIES Full ROM  PLAN OF CARE Provide calm/safe environment. Vital signs assessed twice daily. ED BHU Assessment once each 12-hour shift.  Assure the ED provider has rounded once each shift. Provide and encourage hygiene. Provide redirection as needed. Assess for escalating behavior; address immediately and inform ED provider.  Assess family dynamic and appropriateness for visitation as needed: Yes.  ; If necessary, describe findings:  Educate the patient/family about BHU procedures/visitation: Yes.  ; If necessary, describe findings:   

## 2019-07-23 NOTE — ED Notes (Signed)
Patient observed lying in bed with eyes closed  Even, unlabored respirations observed   NAD pt appears to be sleeping  I will continue to monitor along with every 15 minute visual observations and ongoing security camera monitoring    

## 2019-07-23 NOTE — ED Notes (Signed)
Patient given juice upon his request

## 2019-07-23 NOTE — ED Notes (Signed)
Received a call from someone stating they are his mother  - she states  "I need him to get a diagnosis - his lawyer told me to IVC him and send him there so that the court date could be postponed  - He needs a diagnosis - that is all he needs  He needs a diagnosis - I am trying to help him."  I listened to her but no pt information about the pt provided to her  Pt informed of the call

## 2019-07-23 NOTE — ED Notes (Signed)
Pt is currently sleeping in his bed.

## 2019-07-23 NOTE — ED Notes (Signed)

## 2019-07-23 NOTE — ED Notes (Signed)
He has used the telephone  - NAD observed

## 2019-07-23 NOTE — ED Notes (Signed)
He is currently talking on the phone
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
## 2019-07-24 MED ORDER — QUETIAPINE FUMARATE 50 MG PO TABS: 50.0000 mg | ORAL_TABLET | Freq: Every day | ORAL | 1 refills | Status: DC

## 2019-07-24 MED ORDER — QUETIAPINE FUMARATE 50 MG PO TABS: 50.0000 mg | ORAL_TABLET | Freq: Every day | ORAL | 1 refills | Status: AC
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
## 2019-07-24 NOTE — ED Notes (Signed)
Pt ambulated to bathroom 

## 2019-07-24 NOTE — ED Provider Notes (Signed)
5:17 AM 07/24/2019  Blood pressure (!) 114/54, pulse 64, temperature 98.6 F (37 C), temperature source Oral, resp. rate 16, height 5\' 7"  (1.702 m), weight 78.5 kg, SpO2 100 %.  Presented with SI.   The patient is calm and cooperative at this time.  There have been no acute events since the last update.  Plan to admit. Waiting for bed.    Vanessa Blanchard, MD 07/24/19 [phone]

## 2019-07-24 NOTE — ED Notes (Signed)
Patient voices understanding of discharge instructions, no signs of distress, all belongings given back to patient, patient also received a bus ticket.

## 2019-07-24 NOTE — ED Notes (Signed)
Nurse talked with Patient, He denies Si/hi or avh, He is calm and cooperative, will continue to monitor, Dr. Unk Lightning  In to see him and will be discharging him home, Patient would like His prescription of seroquel prior to discharge.

## 2019-07-24 NOTE — ED Notes (Addendum)
Patient took a Engineer, structural in to talk to him, patient quickly got out of shower to talk with Doctor, Patient is calm and cooperative.

## 2019-09-25 ENCOUNTER — Other Ambulatory Visit: Payer: Self-pay

## 2019-09-25 ENCOUNTER — Ambulatory Visit: Payer: Self-pay | Admitting: Physician Assistant

## 2019-09-25 DIAGNOSIS — Z202 Contact with and (suspected) exposure to infections with a predominantly sexual mode of transmission: Secondary | ICD-10-CM

## 2019-09-25 DIAGNOSIS — Z113 Encounter for screening for infections with a predominantly sexual mode of transmission: Secondary | ICD-10-CM

## 2019-09-25 LAB — GRAM STAIN

## 2019-09-25 MED ORDER — AZITHROMYCIN 500 MG PO TABS
1000.0000 mg | ORAL_TABLET | Freq: Once | ORAL | Status: AC
  Administered 2019-09-25: 15:00:00 1000 mg via ORAL

## 2019-09-25 NOTE — Progress Notes (Signed)
Here today for STD screening. Declines bloodwork. Rheagan Nayak, RN ? ?

## 2019-09-26 ENCOUNTER — Encounter: Payer: Self-pay | Admitting: Physician Assistant

## 2019-09-26 NOTE — Progress Notes (Signed)
Gram stain reviewed and is negative today, no treatment needed for gram stain per standing order. Pt received Azithromycin 1g po DOT per Antoine Primas, PA order. Provider orders completed.Ronny Bacon, RN

## 2019-09-26 NOTE — Progress Notes (Signed)
STI clinic/screening visit  Subjective:  Michael [name] is a 23 y.o. male being seen today for an STI screening visit. The patient reports they do not have symptoms.  Patient has the following medical conditions:   Patient Active Problem List   Diagnosis Date Noted  . Renal calculus 06/06/2019  . 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 Suicidal ideation 09/19/2017  . Cannabis abuse 09/19/2017  . Hx of tympanostomy tubes 09/17/2015  . History of ADHD 05/09/2015  . Acne 05/09/2015  . Esophageal reflux 05/09/2015  . Personal history of traumatic brain injury 05/09/2015  . 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 PTSD (post-traumatic stress disorder) 05/09/2015  . Chromophytosis 05/09/2015     Chief Complaint  Patient presents with  . SEXUALLY TRANSMITTED DISEASE    HPI  Patient reports that he is not having any symptoms but is a contact to Chlamydia.  Declines blood work today.  See flowsheet for further details and programmatic requirements.    The following portions of the patient's history were reviewed and updated as appropriate: allergies, current medications, past medical history, past social history, past surgical history and problem list.  Objective:  There were no vitals filed for this visit.  Physical Exam Constitutional:      General: He is not in acute distress.    Appearance: Normal appearance.  HENT:     Head: Normocephalic and atraumatic.     Mouth/Throat:     Mouth: Mucous membranes are moist.     Pharynx: Oropharynx is clear. No oropharyngeal exudate or posterior oropharyngeal erythema.  Eyes:     Conjunctiva/sclera: Conjunctivae normal.  Neck:     Musculoskeletal: Neck supple.  Pulmonary:     Effort: Pulmonary effort is normal.  Abdominal:     Palpations: Abdomen is soft. There is no mass.     Tenderness: There is no abdominal tenderness. There is no guarding or rebound.   Genitourinary:    Penis: Normal.      Scrotum/Testes: Normal.     Comments: Pubic area without nits, lice, edema, erythema, lesions and inguinal adenopathy. Penis circumcised and without discharge at meatus. Lymphadenopathy:     Cervical: No cervical adenopathy.  Skin:    General: Skin is warm and dry.     Findings: No bruising, erythema, lesion or rash.  Neurological:     Mental Status: He is alert and oriented to person, place, and time.  Psychiatric:        Mood and Affect: Mood normal.        Behavior: Behavior normal.        Thought Content: Thought content normal.        Judgment: Judgment normal.       Assessment and Plan:  Michael [name] is a 23 y.o. male presenting to the [facility] for STI screening  1. Screening for STD (sexually transmitted disease) Patient into clinic without symptoms.  Declines blood work today. Rec condoms with all sex. Await test results.  Counseled that RN will call if needs to RTC for further treatment once result is back.  - Gonococcus culture - Gram stain  2. Chlamydia contact Treat as a contact to Chlamydia with Azithromycin 1g po DOT today. No sex for 7 days and until after partner completes treatment. RTC for re-treatment if vomits < 2 hr after taking medicine. - azithromycin (ZITHROMAX) tablet 1,000 mg     No follow-ups on file.  No future appointments.  Jerene Dilling, PA

## 2019-09-30 LAB — GONOCOCCUS CULTURE

## 2019-11-24 IMAGING — CT CT RENAL STONE PROTOCOL
3 of 4 series · 9 of 46 positions shown, 16 images · non-contrast
Comparison: None.

CLINICAL DATA: Right flank pain during urination.

EXAM:
CT ABDOMEN AND PELVIS WITHOUT CONTRAST
TECHNIQUE: Multidetector CT imaging of the abdomen and pelvis was performed
following the standard protocol without IV contrast.

[Series 4: lung bases · axial · 0.64mm/px · z∈[-363,-283]mm · 5 of 25 slices shown, 10 images]
[im 5/25  soft-tissue]
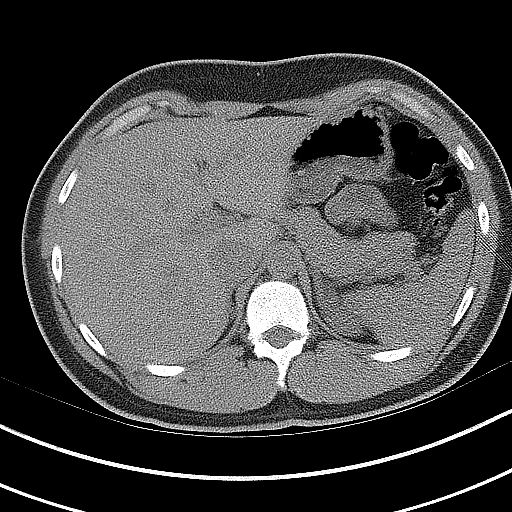
[im 5/25  bone]
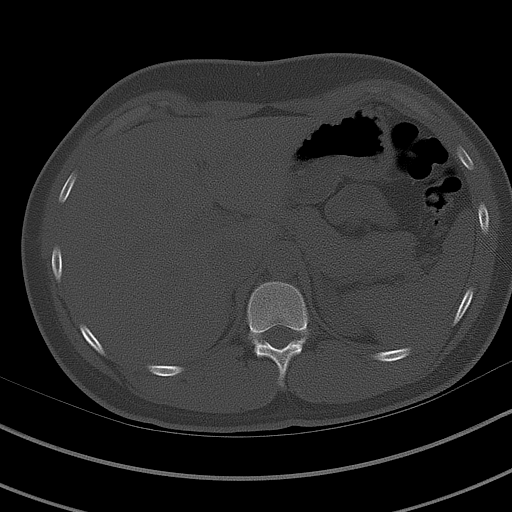
[im 9/25  soft-tissue]
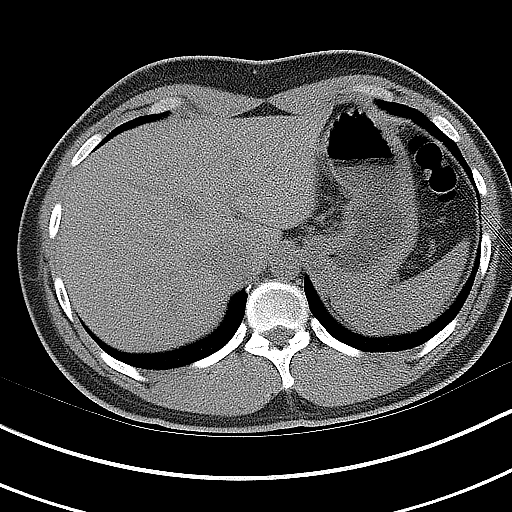
[im 9/25  lung]
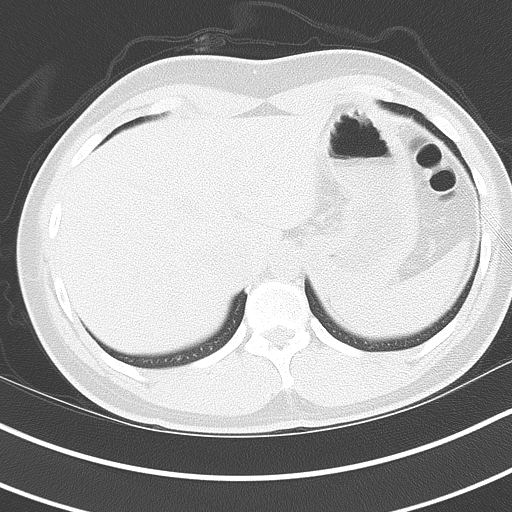
[im 13/25  soft-tissue]
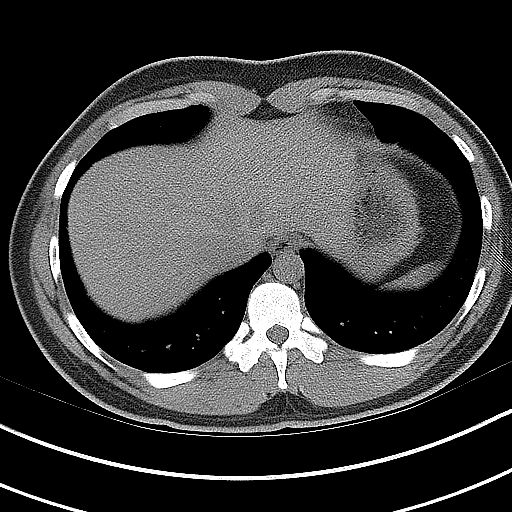
[im 13/25  lung]
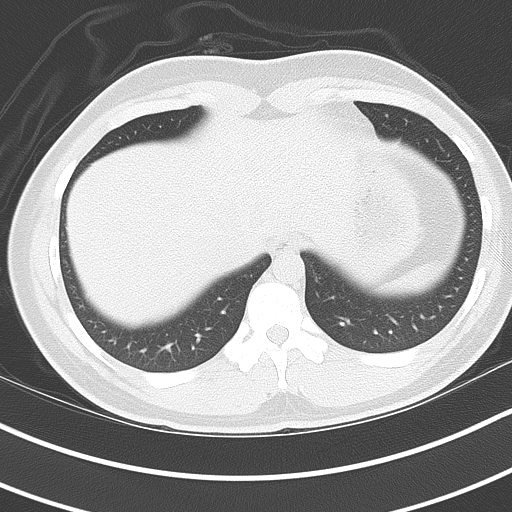
[im 17/25  soft-tissue]
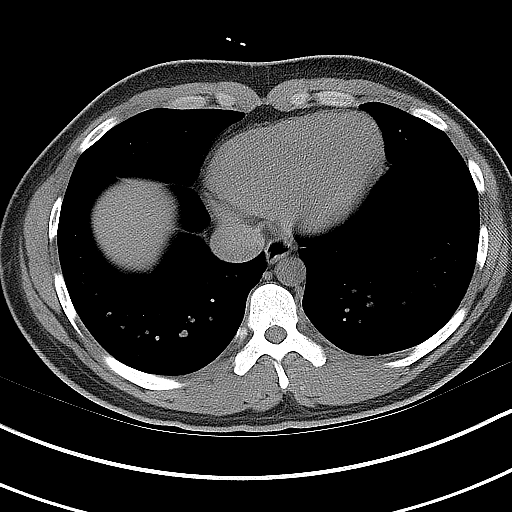
[im 17/25  lung]
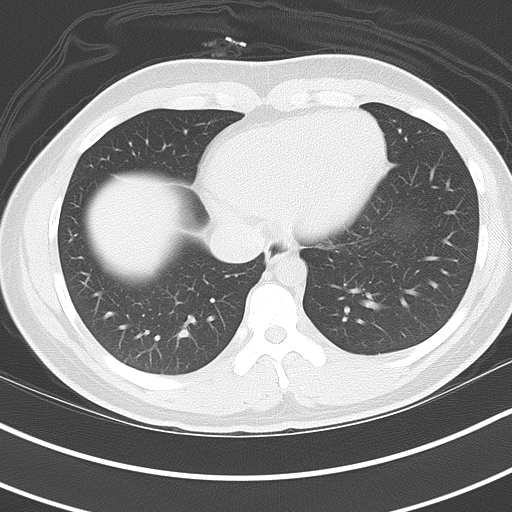
[im 21/25  soft-tissue]
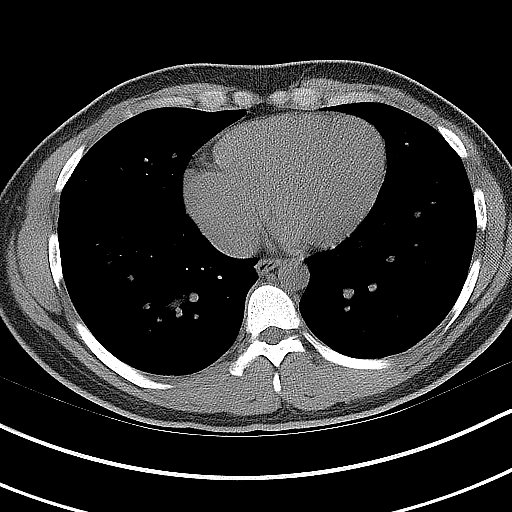
[im 21/25  lung]
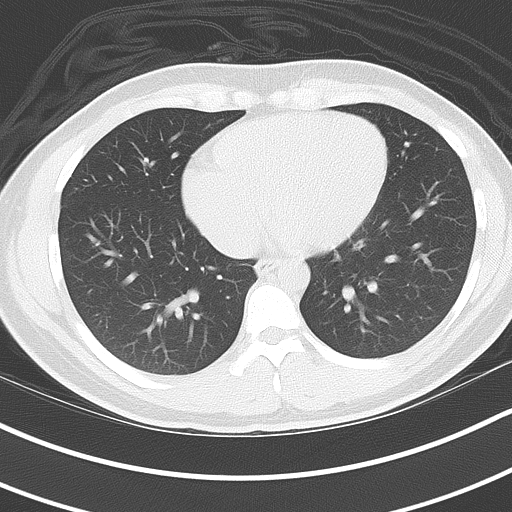

[Series 5: coronal · coronal · 0.71mm/px · 3 of 109 slices shown, 4 images]
[im 37/109  soft-tissue]
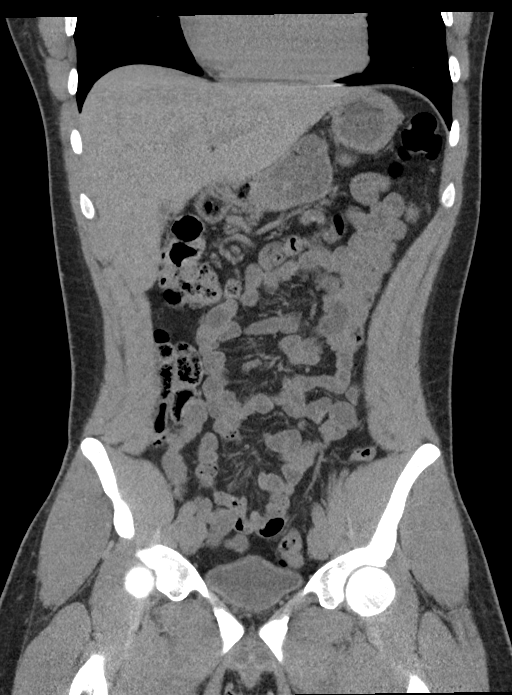
[im 49/109  soft-tissue]
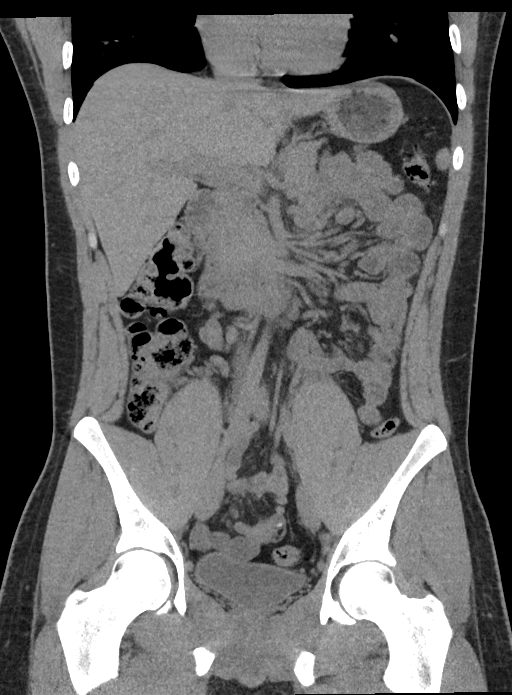
[im 49/109  bone]
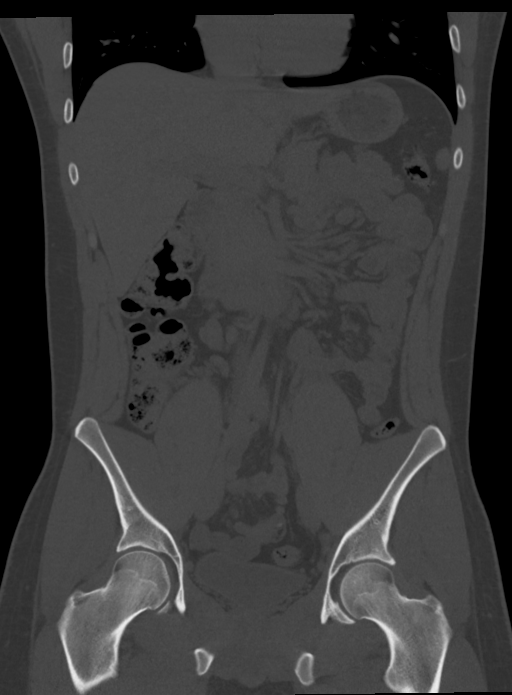
[im 61/109  soft-tissue]
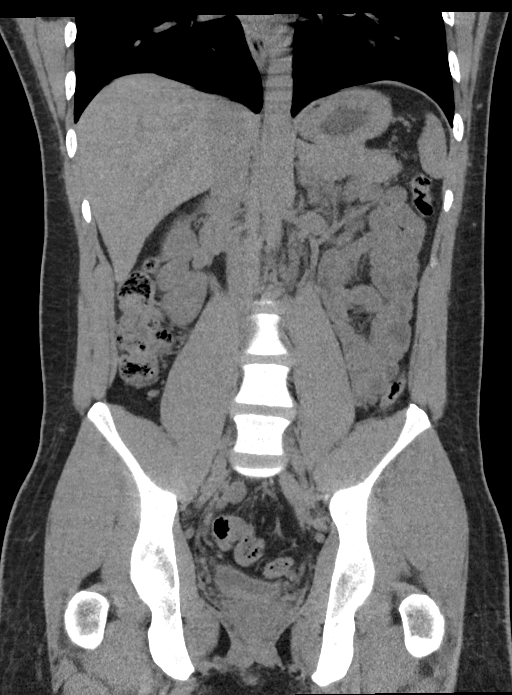

[Series 6: sagittal · sagittal · 0.52mm/px · 1 of 149 slices shown, 2 images]
[im 50/149  soft-tissue]
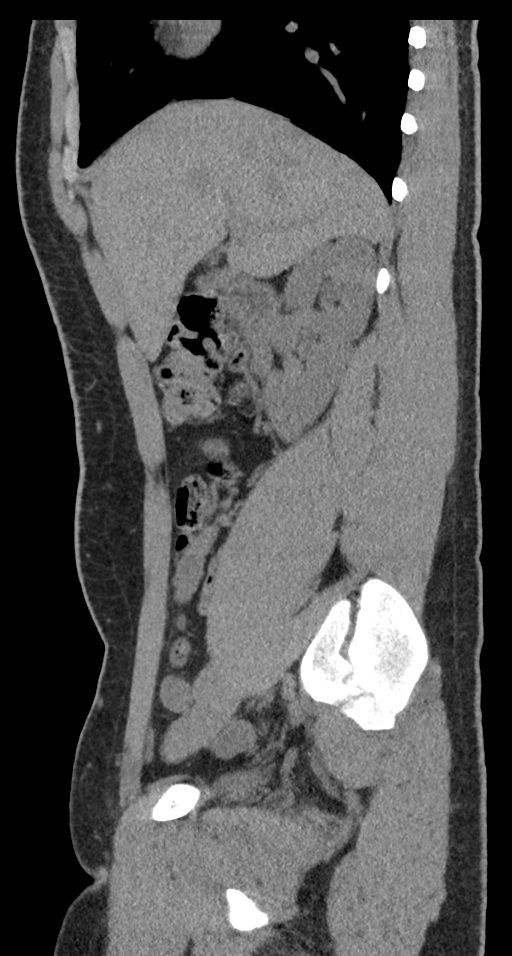
[im 50/149  bone]
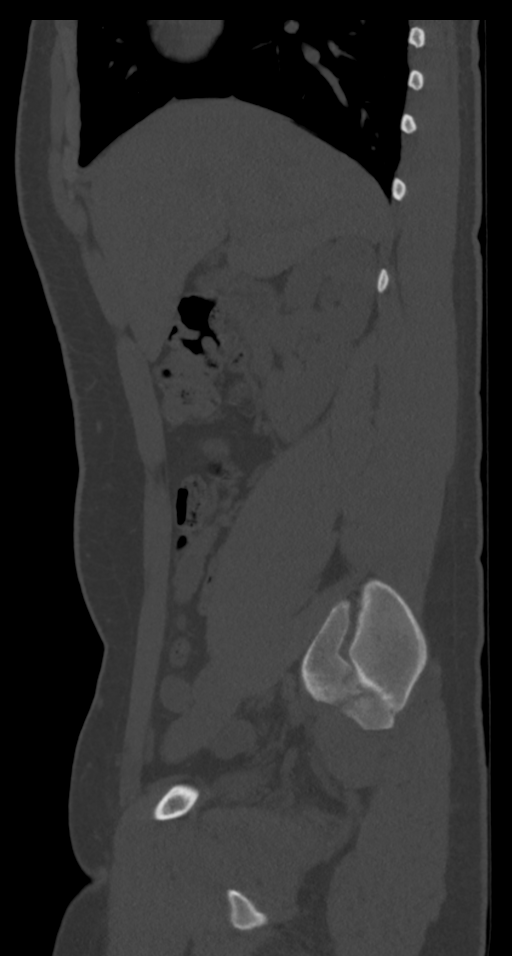

[9 of 46 positions shown; findings below may reference images not displayed]

FINDINGS: Lower chest: No acute abnormality.

Hepatobiliary: No focal liver abnormality is seen. No gallstones,
gallbladder wall thickening, or biliary dilatation.

Pancreas: Unremarkable. No pancreatic ductal dilatation or
surrounding inflammatory changes.

Spleen: Normal in size without focal abnormality.

Adrenals/Urinary Tract: The adrenal glands are normal. There is a 4
mm nonobstructing stone in the mid to lower pole right kidney. There
is no hydronephrosis bilaterally. The bladder is normal.

Stomach/Bowel: The stomach is normal. There is no small bowel
obstruction. The appendix is normal in caliber without surrounding
inflammation. 1.3 mm appendiculith is identified in the appendix.
There is diverticulosis of colon without diverticulitis.

Vascular/Lymphatic: No significant vascular findings are present. No
enlarged abdominal or pelvic lymph nodes.

Reproductive: Prostate is unremarkable.

Other: None.

Musculoskeletal: No acute or significant osseous findings.
IMPRESSION: Nonobstructing stone identified within the right kidney. No
hydronephrosis bilaterally.

1.3 mm appendicular is identified without inflammation surrounding
the appendix.

## 2023-10-25 ENCOUNTER — Emergency Department (HOSPITAL_COMMUNITY)
Admission: EM | Admit: 2023-10-25 | Discharge: 2023-10-25 | Discharge status: Against Medical Advice | Payer: MEDICAID | Attending: Emergency Medicine | Admitting: Emergency Medicine

## 2023-10-25 ENCOUNTER — Other Ambulatory Visit: Payer: Self-pay

## 2023-10-25 DIAGNOSIS — R1084 Generalized abdominal pain: Secondary | ICD-10-CM | POA: Insufficient documentation

## 2023-10-25 DIAGNOSIS — R112 Nausea with vomiting, unspecified: Secondary | ICD-10-CM | POA: Insufficient documentation

## 2023-10-25 DIAGNOSIS — Z5321 Procedure and treatment not carried out due to patient leaving prior to being seen by health care provider: Secondary | ICD-10-CM | POA: Diagnosis not present

## 2023-10-25 LAB — CBC
HCT: 48 % (ref 39.0–52.0)
Hemoglobin: 16.7 g/dL (ref 13.0–17.0)
MCH: 32.7 pg (ref 26.0–34.0)
MCHC: 34.8 g/dL (ref 30.0–36.0)
MCV: 93.9 fL (ref 80.0–100.0)
Platelets: 148 10*3/uL — ABNORMAL LOW (ref 150–400)
RBC: 5.11 MIL/uL (ref 4.22–5.81)
RDW: 12 % (ref 11.5–15.5)
WBC: 7.2 10*3/uL (ref 4.0–10.5)
nRBC: 0 % (ref 0.0–0.2)

## 2023-10-25 LAB — COMPREHENSIVE METABOLIC PANEL
ALT: 26 U/L (ref 0–44)
AST: 25 U/L (ref 15–41)
Albumin: 3.5 g/dL (ref 3.5–5.0)
Alkaline Phosphatase: 72 U/L (ref 38–126)
Anion gap: 8 (ref 5–15)
BUN: 13 mg/dL (ref 6–20)
CO2: 25 mmol/L (ref 22–32)
Calcium: 8.4 mg/dL — ABNORMAL LOW (ref 8.9–10.3)
Chloride: 103 mmol/L (ref 98–111)
Creatinine, Ser: 0.93 mg/dL (ref 0.61–1.24)
GFR, Estimated: >60 mL/min (ref >60)
Glucose, Bld: 126 mg/dL — ABNORMAL HIGH (ref 70–99)
Potassium: 3.6 mmol/L (ref 3.5–5.1)
Sodium: 136 mmol/L (ref 135–145)
Total Bilirubin: 0.5 mg/dL (ref <1.2)
Total Protein: 6.1 g/dL — ABNORMAL LOW (ref 6.5–8.1)

## 2023-10-25 LAB — URINALYSIS, ROUTINE W REFLEX MICROSCOPIC
Bacteria, UA: NONE SEEN
Bilirubin Urine: NEGATIVE
Glucose, UA: NEGATIVE mg/dL
Ketones, ur: NEGATIVE mg/dL
Nitrite: NEGATIVE
Protein, ur: 100 mg/dL — AB
RBC / HPF: >50 RBC/hpf (ref 0–5)
Specific Gravity, Urine: 1.021 (ref 1.005–1.030)
WBC, UA: >50 WBC/hpf (ref 0–5)
pH: 5 (ref 5.0–8.0)

## 2023-10-25 LAB — LIPASE, BLOOD: Lipase: 35 U/L (ref 11–51)

## 2023-10-25 NOTE — ED Notes (Signed)
Phlebotomy Tech reports that pt told her that he was leaving and did not want to wait. He stated that he will follow up with PMD

## 2023-10-25 NOTE — ED Notes (Signed)
Pt wanted to leave, triage nurse notified and pt removed from board.

## 2023-10-25 NOTE — ED Triage Notes (Signed)
Pt states that eh ahs had generalized abdominal pain with nausea and vomiting x 1 day
